# Patient Record
Sex: Female | Born: 1945 | Race: White | Hispanic: No | Marital: Married | Smoking: Never smoker
Health system: Southern US, Community
[De-identification: ages and names within clinical notes are randomized; demographics above are authoritative.]

## PROBLEM LIST (undated history)

## (undated) DIAGNOSIS — T8859XA Other complications of anesthesia, initial encounter: Secondary | ICD-10-CM

## (undated) DIAGNOSIS — T4145XA Adverse effect of unspecified anesthetic, initial encounter: Secondary | ICD-10-CM

## (undated) DIAGNOSIS — D649 Anemia, unspecified: Secondary | ICD-10-CM

## (undated) DIAGNOSIS — Z8489 Family history of other specified conditions: Secondary | ICD-10-CM

## (undated) DIAGNOSIS — Z889 Allergy status to unspecified drugs, medicaments and biological substances status: Secondary | ICD-10-CM

## (undated) DIAGNOSIS — R011 Cardiac murmur, unspecified: Secondary | ICD-10-CM

## (undated) HISTORY — PX: LUMBAR DISC SURGERY: SHX700

## (undated) HISTORY — PX: ABDOMINAL HYSTERECTOMY: SHX81

## (undated) HISTORY — DX: Allergy status to unspecified drugs, medicaments and biological substances: Z88.9

## (undated) HISTORY — PX: SPINE SURGERY: SHX786

## (undated) HISTORY — DX: Cardiac murmur, unspecified: R01.1

---

## 2000-11-11 ENCOUNTER — Other Ambulatory Visit: Admission: RE | Admit: 2000-11-11 | Discharge: 2000-11-11 | Payer: Self-pay | Admitting: Obstetrics and Gynecology

## 2001-12-28 ENCOUNTER — Encounter: Payer: Self-pay | Admitting: Obstetrics and Gynecology

## 2001-12-28 ENCOUNTER — Encounter: Admission: RE | Admit: 2001-12-28 | Discharge: 2001-12-28 | Payer: Self-pay | Admitting: Obstetrics and Gynecology

## 2004-12-20 ENCOUNTER — Ambulatory Visit (HOSPITAL_COMMUNITY): Admission: RE | Admit: 2004-12-20 | Discharge: 2004-12-21 | Payer: Self-pay | Admitting: Neurosurgery

## 2011-07-18 DIAGNOSIS — I10 Essential (primary) hypertension: Secondary | ICD-10-CM | POA: Diagnosis not present

## 2011-07-18 DIAGNOSIS — E7800 Pure hypercholesterolemia, unspecified: Secondary | ICD-10-CM | POA: Diagnosis not present

## 2011-07-18 DIAGNOSIS — M949 Disorder of cartilage, unspecified: Secondary | ICD-10-CM | POA: Diagnosis not present

## 2011-07-18 DIAGNOSIS — M899 Disorder of bone, unspecified: Secondary | ICD-10-CM | POA: Diagnosis not present

## 2011-07-23 DIAGNOSIS — I10 Essential (primary) hypertension: Secondary | ICD-10-CM | POA: Diagnosis not present

## 2011-07-23 DIAGNOSIS — Z23 Encounter for immunization: Secondary | ICD-10-CM | POA: Diagnosis not present

## 2011-07-23 DIAGNOSIS — Z0000 Encounter for general adult medical examination without abnormal findings: Secondary | ICD-10-CM | POA: Diagnosis not present

## 2011-07-23 DIAGNOSIS — G43109 Migraine with aura, not intractable, without status migrainosus: Secondary | ICD-10-CM | POA: Diagnosis not present

## 2011-08-02 DIAGNOSIS — Z124 Encounter for screening for malignant neoplasm of cervix: Secondary | ICD-10-CM | POA: Diagnosis not present

## 2011-08-02 DIAGNOSIS — Z01419 Encounter for gynecological examination (general) (routine) without abnormal findings: Secondary | ICD-10-CM | POA: Diagnosis not present

## 2011-08-02 DIAGNOSIS — N952 Postmenopausal atrophic vaginitis: Secondary | ICD-10-CM | POA: Diagnosis not present

## 2011-12-16 DIAGNOSIS — Z23 Encounter for immunization: Secondary | ICD-10-CM | POA: Diagnosis not present

## 2012-02-05 DIAGNOSIS — H1044 Vernal conjunctivitis: Secondary | ICD-10-CM | POA: Diagnosis not present

## 2012-02-18 DIAGNOSIS — Z1231 Encounter for screening mammogram for malignant neoplasm of breast: Secondary | ICD-10-CM | POA: Diagnosis not present

## 2012-06-29 DIAGNOSIS — J069 Acute upper respiratory infection, unspecified: Secondary | ICD-10-CM | POA: Diagnosis not present

## 2012-06-29 DIAGNOSIS — J209 Acute bronchitis, unspecified: Secondary | ICD-10-CM | POA: Diagnosis not present

## 2012-07-31 DIAGNOSIS — I10 Essential (primary) hypertension: Secondary | ICD-10-CM | POA: Diagnosis not present

## 2012-07-31 DIAGNOSIS — Z0000 Encounter for general adult medical examination without abnormal findings: Secondary | ICD-10-CM | POA: Diagnosis not present

## 2012-07-31 DIAGNOSIS — E7800 Pure hypercholesterolemia, unspecified: Secondary | ICD-10-CM | POA: Diagnosis not present

## 2012-07-31 DIAGNOSIS — G43109 Migraine with aura, not intractable, without status migrainosus: Secondary | ICD-10-CM | POA: Diagnosis not present

## 2012-09-09 DIAGNOSIS — L0231 Cutaneous abscess of buttock: Secondary | ICD-10-CM | POA: Diagnosis not present

## 2012-10-11 ENCOUNTER — Ambulatory Visit (INDEPENDENT_AMBULATORY_CARE_PROVIDER_SITE_OTHER): Payer: Medicare Other | Admitting: Family Medicine

## 2012-10-11 ENCOUNTER — Ambulatory Visit: Payer: Medicare Other

## 2012-10-11 VITALS — BP 132/70 | HR 100 | Temp 99.4°F | Resp 16 | Ht 61.5 in | Wt 118.6 lb

## 2012-10-11 DIAGNOSIS — R509 Fever, unspecified: Secondary | ICD-10-CM

## 2012-10-11 DIAGNOSIS — R059 Cough, unspecified: Secondary | ICD-10-CM | POA: Diagnosis not present

## 2012-10-11 DIAGNOSIS — R05 Cough: Secondary | ICD-10-CM | POA: Diagnosis not present

## 2012-10-11 LAB — POCT CBC
Granulocyte percent: 73.5 %G (ref 37–80)
HCT, POC: 40 % (ref 37.7–47.9)
Hemoglobin: 12.5 g/dL (ref 12.2–16.2)
Lymph, poc: 1.6 (ref 0.6–3.4)
MCH, POC: 29.7 pg (ref 27–31.2)
MCHC: 31.3 g/dL — AB (ref 31.8–35.4)
MCV: 95 fL (ref 80–97)
MID (cbc): 0.4 (ref 0–0.9)
MPV: 7.8 fL (ref 0–99.8)
POC Granulocyte: 5.7 (ref 2–6.9)
POC LYMPH PERCENT: 20.9 %L (ref 10–50)
POC MID %: 5.6 %M (ref 0–12)
Platelet Count, POC: 264 10*3/uL (ref 142–424)
RBC: 4.21 M/uL (ref 4.04–5.48)
RDW, POC: 13.8 %
WBC: 7.8 10*3/uL (ref 4.6–10.2)

## 2012-10-11 MED ORDER — LEVOFLOXACIN 500 MG PO TABS: 500.0000 mg | ORAL_TABLET | Freq: Every day | ORAL | Status: DC

## 2012-10-11 NOTE — Progress Notes (Signed)
67 yo woman retired [employer] and Record married to Smitty Cords who had bronchitis earlier this year.  Over the last week or so she has developed a dry cough and evening fevers to over 101 degrees F.   Also has night sweats and decreased appetite and loss of energy.  No chest pain or shortness of breath or swollen ankles  Objective:  NAD HEENT:  Unremarkable Neck:  Supple, no adenopathy Chest: rales left base Heart:  Reg, no murmur Skin: clear Ext: no edema  [facility] reading (PRIMARY) by  Dr. Milus Glazier:  Hazy LLL, post op scoliosis surgery  Results for orders placed in visit on 10/11/12  POCT CBC      Result Value Range   WBC 7.8  4.6 - 10.2 K/uL   Lymph, poc 1.6  0.6 - 3.4   POC LYMPH PERCENT 20.9  10 - 50 %L   MID (cbc) 0.4  0 - 0.9   POC MID % 5.6  0 - 12 %M   POC Granulocyte 5.7  2 - 6.9   Granulocyte percent 73.5  37 - 80 %G   RBC 4.21  4.04 - 5.48 M/uL   Hemoglobin 12.5  12.2 - 16.2 g/dL   HCT, POC 19.1  47.8 - 47.9 %   MCV 95.0  80 - 97 fL   MCH, POC 29.7  27 - 31.2 pg   MCHC 31.3 (*) 31.8 - 35.4 g/dL   RDW, POC 29.5     Platelet Count, POC 264  142 - 424 K/uL   MPV 7.8  0 - 99.8 fL   . Assessment: Clinically patient has left lower lobe pneumonia. Surprisingly, the white count is normal. In the context of nighttime fevers, him and have the patient at 10 days of antibiotics in hopes of resolving the infection in the lungs.

## 2012-10-14 DIAGNOSIS — Z124 Encounter for screening for malignant neoplasm of cervix: Secondary | ICD-10-CM | POA: Diagnosis not present

## 2012-10-14 DIAGNOSIS — Z01419 Encounter for gynecological examination (general) (routine) without abnormal findings: Secondary | ICD-10-CM | POA: Diagnosis not present

## 2012-10-19 DIAGNOSIS — M899 Disorder of bone, unspecified: Secondary | ICD-10-CM | POA: Diagnosis not present

## 2012-10-19 DIAGNOSIS — M949 Disorder of cartilage, unspecified: Secondary | ICD-10-CM | POA: Diagnosis not present

## 2012-10-29 ENCOUNTER — Ambulatory Visit (INDEPENDENT_AMBULATORY_CARE_PROVIDER_SITE_OTHER): Payer: Medicare Other | Admitting: Emergency Medicine

## 2012-10-29 VITALS — BP 134/62 | HR 96 | Temp 98.1°F | Resp 18 | Ht 61.0 in | Wt 117.0 lb

## 2012-10-29 DIAGNOSIS — R509 Fever, unspecified: Secondary | ICD-10-CM | POA: Diagnosis not present

## 2012-10-29 LAB — POCT CBC
Granulocyte percent: 77.6 %G (ref 37–80)
HCT, POC: 41 % (ref 37.7–47.9)
Hemoglobin: 13.1 g/dL (ref 12.2–16.2)
MCV: 94.5 fL (ref 80–97)
MID (cbc): 0.4 (ref 0–0.9)
Platelet Count, POC: 240 10*3/uL (ref 142–424)
RBC: 4.34 M/uL (ref 4.04–5.48)

## 2012-10-29 NOTE — Patient Instructions (Addendum)

## 2012-10-29 NOTE — Progress Notes (Signed)
[facility] [address], [place] [address] [phone]  Date:  10/29/2012   Name:  Lisa Cabrera   DOB:  [birth year]   MRN:  [id number]  PCP:  Miguel Aschoff, MD    Chief Complaint: cough, night time fevers and night sweats   History of Present Illness:  Lisa Cabrera is a 67 y.o. very pleasant female patient who presents with the following:  Ill with fever, night sweats and cough.  Says cough is not productive, no wheezing or shortness of breath.  No nasal congestion or drainage.  No sore throat.  Poor appetite. No nausea or vomiting. No stool change.  No hemoptysis or exposure to TB.  Similar symptoms in March and earlier this month both times treated with antibiotics.  Had resolution and now has recurred past few days.  No improvement with over the counter medications or other home remedies. Denies other complaint or health concern today.   There are no active problems to display for this patient.   Past Medical History  Diagnosis Date  . H/O seasonal allergies   . Heart murmur     Past Surgical History  Procedure Laterality Date  . Abdominal hysterectomy      1997  . Spine surgery      History  Substance Use Topics  . Smoking status: Never Smoker   . Smokeless tobacco: Not on file  . Alcohol Use: Yes     Comment: occasionally    Family History  Problem Relation Age of Onset  . Dementia Mother   . Stroke Father   . Kidney disease Father   . Cancer Sister     ovarian    No Known Allergies  Medication list has been reviewed and updated.  Current Outpatient Prescriptions on File Prior to Visit  Medication Sig Dispense Refill  . aspirin 81 MG tablet Take 81 mg by mouth daily.      . Cholecalciferol (VITAMIN D) 2000 UNITS CAPS Take by mouth daily.      Marland Kitchen doxepin (SINEQUAN) 50 MG capsule Take 50 mg by mouth at bedtime.      Marland Kitchen levofloxacin (LEVAQUIN) 500 MG tablet Take 1 tablet (500 mg total) by mouth daily.  10 tablet  0  .  Multiple Vitamins-Minerals (MULTIVITAMIN PO) Take by mouth daily.      . Omega-3 Fatty Acids (FISH OIL PO) Take by mouth daily.      . verapamil (VERELAN PM) 240 MG 24 hr capsule Take 240 mg by mouth daily.       No current facility-administered medications on file prior to visit.    Review of Systems:  As per HPI, otherwise negative.    Physical Examination: Filed Vitals:   10/29/12 1714  BP: 134/62  Pulse: 96  Temp: 98.1 F (36.7 C)  Resp: 18   Filed Vitals:   10/29/12 1714  Height: 5\' 1"  (1.549 m)  Weight: 117 lb (53.071 kg)   Body mass index is 22.12 kg/(m^2). Ideal Body Weight: Weight in (lb) to have BMI = 25: 132  GEN: WDWN, NAD, Non-toxic, A & O x 3 HEENT: Atraumatic, Normocephalic. Neck supple. No masses, No LAD. Ears and Nose: No external deformity. CV: RRR, No M/G/R. No JVD. No thrill. No extra heart sounds. PULM: CTA B, no wheezes, crackles, rhonchi. No retractions. No resp. distress. No accessory muscle use. ABD: S, NT, ND, +BS. No rebound. No HSM. EXTR: No c/c/e NEURO Normal gait.  PSYCH:  Normally interactive. Conversant. Not depressed or anxious appearing.  Calm demeanor.    Assessment and Plan: Bronchitis Follow up as needed   Signed,  Phillips Odor, MD   Results for orders placed in visit on 10/29/12  POCT CBC      Result Value Range   WBC 9.5  4.6 - 10.2 K/uL   Lymph, poc 1.7  0.6 - 3.4   POC LYMPH PERCENT 18.2  10 - 50 %L   MID (cbc) 0.4  0 - 0.9   POC MID % 4.2  0 - 12 %M   POC Granulocyte 7.4 (*) 2 - 6.9   Granulocyte percent 77.6  37 - 80 %G   RBC 4.34  4.04 - 5.48 M/uL   Hemoglobin 13.1  12.2 - 16.2 g/dL   HCT, POC 78.2  95.6 - 47.9 %   MCV 94.5  80 - 97 fL   MCH, POC 30.2  27 - 31.2 pg   MCHC 32.0  31.8 - 35.4 g/dL   RDW, POC 21.3     Platelet Count, POC 240  142 - 424 K/uL   MPV 7.6  0 - 99.8 fL

## 2012-11-04 ENCOUNTER — Other Ambulatory Visit: Payer: Self-pay

## 2012-11-04 ENCOUNTER — Ambulatory Visit (INDEPENDENT_AMBULATORY_CARE_PROVIDER_SITE_OTHER): Payer: Medicare Other | Admitting: Family Medicine

## 2012-11-04 VITALS — BP 118/72 | HR 99 | Temp 99.0°F | Resp 17 | Ht 61.0 in | Wt 117.0 lb

## 2012-11-04 DIAGNOSIS — R509 Fever, unspecified: Secondary | ICD-10-CM | POA: Diagnosis not present

## 2012-11-04 DIAGNOSIS — R059 Cough, unspecified: Secondary | ICD-10-CM

## 2012-11-04 DIAGNOSIS — R05 Cough: Secondary | ICD-10-CM

## 2012-11-04 MED ORDER — LEVOFLOXACIN 500 MG PO TABS: 500.0000 mg | ORAL_TABLET | Freq: Every day | ORAL | Status: DC

## 2012-11-04 NOTE — Progress Notes (Signed)
67 yo woman with 1 month of dry cough which  Originally seemed to respond to Levaquin but recurred after 10 days off the medicine.  Some loss of appetite.  Objective:  NAD Oroph:  Clear Chest: rales left base Cor:  Reg, no  Murmur Skin: clear Ext:  No edema.  Assessment: unusual  Syndrome of cough and evening fever with night  Sweats.  Plan:   Cough - Plan: levofloxacin (LEVAQUIN) 500 MG tablet  Fever, unspecified - Plan: levofloxacin (LEVAQUIN) 500 MG tablet If not improving, or if sx recur, chest CT/consult/TB test.  Signed, Elvina Sidle, MD

## 2012-11-19 ENCOUNTER — Ambulatory Visit: Payer: Medicare Other

## 2012-11-19 ENCOUNTER — Ambulatory Visit (INDEPENDENT_AMBULATORY_CARE_PROVIDER_SITE_OTHER): Payer: Medicare Other | Admitting: Family Medicine

## 2012-11-19 VITALS — BP 120/64 | HR 107 | Temp 98.7°F | Resp 18 | Ht 61.5 in | Wt 115.0 lb

## 2012-11-19 DIAGNOSIS — R05 Cough: Secondary | ICD-10-CM | POA: Diagnosis not present

## 2012-11-19 DIAGNOSIS — Z111 Encounter for screening for respiratory tuberculosis: Secondary | ICD-10-CM | POA: Diagnosis not present

## 2012-11-19 DIAGNOSIS — R059 Cough, unspecified: Secondary | ICD-10-CM

## 2012-11-19 DIAGNOSIS — R509 Fever, unspecified: Secondary | ICD-10-CM | POA: Diagnosis not present

## 2012-11-19 LAB — POCT URINALYSIS DIPSTICK
Bilirubin, UA: NEGATIVE
Leukocytes, UA: NEGATIVE
Nitrite, UA: NEGATIVE
pH, UA: 6.5

## 2012-11-19 LAB — POCT CBC
HCT, POC: 44.3 % (ref 37.7–47.9)
Lymph, poc: 1.6 (ref 0.6–3.4)
MCHC: 31.8 g/dL (ref 31.8–35.4)
POC Granulocyte: 5.9 (ref 2–6.9)
POC LYMPH PERCENT: 20.8 %L (ref 10–50)
RDW, POC: 14.6 %

## 2012-11-19 LAB — POCT SEDIMENTATION RATE: POCT SED RATE: 64 mm/hr — AB (ref 0–22)

## 2012-11-19 MED ORDER — AZITHROMYCIN 250 MG PO TABS: ORAL_TABLET | ORAL | Status: DC

## 2012-11-19 NOTE — Progress Notes (Signed)
[address]   [place], [place]  [address]   [phone]  Subjective:    Patient ID: Lisa Cabrera, female    DOB: [birth year], 67 y.o.   MRN: [id number]  HPI This 67 y.o. female presents for evaluation of persistent cough.  Onset of symptoms in 05/2012.  Had fever, night sweats, cough; treated with Zpack and symptoms resolved.  After a trip to [place] in 09/2012, developed the same thing with cough, fever, night sweats.  10/11/12-- Prescribed Levaquin by [facility]; improved for one week and then symptoms recurred (fever, night sweats, cough).  Returned for reevaluation by Dareen Piano on 7/31 due to recurrent symptoms; no treatment prescribed;  symptoms did not resolve.  Reevaluated by [facility] on 11/04/12; rx for Levaquin ten days with resolution of symptoms temporarily.   Last night on 11/18/12, night sweats, fever Tmax 101.3.  No sputum.  No cough now.  No SOB.  Decreased appetite.  No rhinorrhea, nasal congestion, sore throat, ear pain.  No n/v/d/c.  No abdominal pain.  No urinary symptoms; no dysuria, urgency, hematuria.  No joint pains, swelling, myalgias.  No tick bites this summer.   Traveled to [facility] July 4-9; flew.  No foreign travel.  Some weight loss.  Weight down 3 pounds.  No smoking hx.  No other family members sick.  Very worried about recurrent symptoms.  PCP: Tenny Craw at [place]  Review of Systems  Constitutional: Positive for fever, diaphoresis, appetite change and unexpected weight change. Negative for activity change.  HENT: Negative for ear pain, congestion, sore throat, rhinorrhea, sneezing, trouble swallowing, voice change and postnasal drip.   Respiratory: Negative for cough, shortness of breath, wheezing and stridor.   Cardiovascular: Negative for chest pain, palpitations and leg swelling.  Gastrointestinal: Negative for nausea, vomiting, abdominal pain, diarrhea, constipation, blood in stool, anal bleeding and rectal pain.  Genitourinary: Negative for dysuria, urgency,  frequency, hematuria and flank pain.  Musculoskeletal: Negative for myalgias, back pain and arthralgias.  Skin: Negative for rash.  Neurological: Negative for headaches.   Past Medical History  Diagnosis Date  . H/O seasonal allergies   . Heart murmur    Past Surgical History  Procedure Laterality Date  . Abdominal hysterectomy      1997  . Spine surgery     No Known Allergies Current Outpatient Prescriptions on File Prior to Visit  Medication Sig Dispense Refill  . aspirin 81 MG tablet Take 81 mg by mouth daily.      . Cholecalciferol (VITAMIN D) 2000 UNITS CAPS Take by mouth daily.      Marland Kitchen doxepin (SINEQUAN) 50 MG capsule Take 50 mg by mouth at bedtime.      . Multiple Vitamins-Minerals (MULTIVITAMIN PO) Take by mouth daily.      . Omega-3 Fatty Acids (FISH OIL PO) Take by mouth daily.      . verapamil (VERELAN PM) 240 MG 24 hr capsule Take 240 mg by mouth daily.      Marland Kitchen levofloxacin (LEVAQUIN) 500 MG tablet Take 1 tablet (500 mg total) by mouth daily.  10 tablet  0   No current facility-administered medications on file prior to visit.       Objective:   Physical Exam  Nursing note and vitals reviewed. Constitutional: She is oriented to person, place, and time. She appears well-developed and well-nourished. No distress.  HENT:  Head: Normocephalic and atraumatic.  Right Ear: External ear normal.  Left Ear: External ear normal.  Nose: Nose normal.  Mouth/Throat: Oropharynx is clear and moist.  Eyes: Conjunctivae and EOM are normal. Pupils are equal, round, and reactive to light.  Neck: Normal range of motion. Neck supple. No thyromegaly present.  Cardiovascular: Normal rate, regular rhythm and intact distal pulses.   Murmur heard. Pulmonary/Chest: Effort normal and breath sounds normal. No respiratory distress. She has no wheezes. She has no rales.  Abdominal: Soft. Bowel sounds are normal. She exhibits no distension and no mass. There is no tenderness. There is no  rebound and no guarding.  Musculoskeletal: Normal range of motion.  Lymphadenopathy:    She has no cervical adenopathy.  Neurological: She is alert and oriented to person, place, and time.  Skin: Skin is warm and dry. No rash noted. She is not diaphoretic.  Psychiatric: She has a normal mood and affect. Her behavior is normal.   RECORDS REVIEWED FROM THREE PREVIOUS VISITS.     Results for orders placed in visit on 11/19/12  POCT CBC      Result Value Range   WBC 7.9  4.6 - 10.2 K/uL   Lymph, poc 1.6  0.6 - 3.4   POC LYMPH PERCENT 20.8  10 - 50 %L   MID (cbc) 0.4  0 - 0.9   POC MID % 4.6  0 - 12 %M   POC Granulocyte 5.9  2 - 6.9   Granulocyte percent 74.6  37 - 80 %G   RBC 4.70  4.04 - 5.48 M/uL   Hemoglobin 14.1  12.2 - 16.2 g/dL   HCT, POC 14.7  82.9 - 47.9 %   MCV 94.3  80 - 97 fL   MCH, POC 30.0  27 - 31.2 pg   MCHC 31.8  31.8 - 35.4 g/dL   RDW, POC 56.2     Platelet Count, POC 256  142 - 424 K/uL   MPV 7.6  0 - 99.8 fL  POCT URINALYSIS DIPSTICK      Result Value Range   Color, UA yellow     Clarity, UA clear     Glucose, UA neg     Bilirubin, UA neg     Ketones, UA neg     Spec Grav, UA <=1.005     Blood, UA trace-intact     pH, UA 6.5     Protein, UA neg     Urobilinogen, UA 0.2     Nitrite, UA neg     Leukocytes, UA Negative     [facility] reading (PRIMARY) by  Dr. Katrinka Blazing. CXR: NAD.  PPD placed by Georg Ruddle.   Assessment & Plan:  Cough - Plan: POCT SEDIMENTATION RATE, TB Skin Test, DG Chest 2 View  Fever, unspecified - Plan: POCT CBC, POCT urinalysis dipstick, CANCELED: POCT UA - Microscopic Only  1.  Recurrent fever:  Persistent/recurrent issue for the past 4-6 weeks.  No associated symptoms with acute fever yet recurrent cough in past six weeks.  Benign exam. CXR negative.  PPD placed; to start Zithromax if cough develops. Refer to ID for further evaluation.  If develops cough, consider CT chest to rule out contributing etiology to fever and cough.   Non-toxic.  Close follow-up. 2.  Recurrent cough: Improved at this time but associated with fever, night sweats.  CXR negative; PPD placed.  Ddx includes infectious process, rheumatological process.

## 2012-11-19 NOTE — Progress Notes (Signed)
  Tuberculosis Risk Questionnaire  1. No Were you born outside the [place] in one of the following parts of the world: [place], [place], [place], [place] or [place]?     2. No Have you traveled outside the [place] and lived for more than one month in one of the following parts of the world: [place], [place], [place], [place] or [place]?    3. No Do you have a compromised immune system such as from any of the following conditions:HIV/AIDS, organ or bone marrow transplantation, diabetes, immunosuppressive medicines (e.g. Prednisone, Remicaide), leukemia, lymphoma, cancer of the head or neck, gastrectomy or jejunal bypass, end-stage renal disease (on dialysis), or silicosis?     4. No Have you ever or do you plan on working in: a residential care center, a health care facility, a jail or prison or homeless shelter?    5. No Have you ever: injected illegal drugs, used crack cocaine, lived in a homeless shelter  or been in jail or prison?     6. No Have you ever been exposed to anyone with infectious tuberculosis?    Tuberculosis Symptom Questionnaire  Do you currently have any of the following symptoms?  1. Yes  Unexplained cough lasting more than 3 weeks?   2. Yes  Unexplained fever lasting more than 3 weeks.   3. Yes  Night Sweats (sweating that leaves the bedclothes and sheets wet)     4. No Shortness of Breath   5. No Chest Pain   6. Yes  Unintentional weight loss    7. No Unexplained fatigue (very tired for no reason)

## 2012-11-19 NOTE — Patient Instructions (Addendum)
1. RETURN IN 48-72 HOURS FOR TB SKIN TEST READ. 2.  START ZITHROMAX IF YOU DEVELOP A COUGH. 3.  YOU WILL BE CONTACTED IN THE UPCOMING WEEK WITH AN APPOINTMENT WITH INFECTIOUS DISEASE SPECIALIST.

## 2012-11-21 ENCOUNTER — Ambulatory Visit (INDEPENDENT_AMBULATORY_CARE_PROVIDER_SITE_OTHER): Payer: Medicare Other | Admitting: *Deleted

## 2012-11-21 DIAGNOSIS — Z111 Encounter for screening for respiratory tuberculosis: Secondary | ICD-10-CM

## 2012-11-21 DIAGNOSIS — Z09 Encounter for follow-up examination after completed treatment for conditions other than malignant neoplasm: Secondary | ICD-10-CM

## 2012-11-21 LAB — TB SKIN TEST: TB Skin Test: NEGATIVE

## 2012-11-22 ENCOUNTER — Ambulatory Visit (INDEPENDENT_AMBULATORY_CARE_PROVIDER_SITE_OTHER): Payer: Medicare Other | Admitting: Family Medicine

## 2012-11-22 VITALS — BP 123/69 | HR 94 | Temp 97.8°F | Resp 16 | Ht 61.5 in | Wt 115.0 lb

## 2012-11-22 DIAGNOSIS — R05 Cough: Secondary | ICD-10-CM

## 2012-11-22 DIAGNOSIS — R509 Fever, unspecified: Secondary | ICD-10-CM

## 2012-11-22 DIAGNOSIS — R059 Cough, unspecified: Secondary | ICD-10-CM

## 2012-11-22 LAB — POCT CBC
Hemoglobin: 12.6 g/dL (ref 12.2–16.2)
MCH, POC: 29.4 pg (ref 27–31.2)
MPV: 7.3 fL (ref 0–99.8)
POC Granulocyte: 5.4 (ref 2–6.9)
POC MID %: 5.3 %M (ref 0–12)
RBC: 4.29 M/uL (ref 4.04–5.48)
WBC: 7.4 10*3/uL (ref 4.6–10.2)

## 2012-11-22 LAB — POCT SEDIMENTATION RATE: POCT SED RATE: 60 mm/hr — AB (ref 0–22)

## 2012-11-22 NOTE — Progress Notes (Signed)
[address]   [place], [place]  [address]   [phone]  Subjective:    Patient ID: Lisa Cabrera, female    DOB: 1946-01-05, 67 y.o.   MRN: [id number]  HPI This 67 y.o. female presents for 72 hour follow-up/evaluation of fever. Onset of fever 11/17/12.  Fever trending down; TMax 100.1.  +chills but less severe.  Starts mid to late afternoon; night sweats are no better.  Appetite still down.  Never started coughing so never started Zithromax. S/p PPD negative; returned yesterday for read.  Does not feel that badly; no headache; no ST, ear pain, rhinorrhea, nasal congestion.  No cough other than small cough. No SOB.  No n/v/d.  No dysuria, frequency. No history of recurrent UTIs.  No rash.  No muscle aches; no chest pain, palpitations.  No joint pains or aches. No abdominal pain  Appetite is off; weight down. Jerolyn Center, MD of ID appointment on September 3.  Keeping a fever log; record it once per day and anytime pt feels hot. No family history of autoimmune diseases. Mammogram last fall 21308.  Pap smear UTD.  Colonoscopy UTD 2009.     Review of Systems  Constitutional: Positive for fever, chills, diaphoresis, appetite change, fatigue and unexpected weight change. Negative for activity change.  HENT: Negative for ear pain, congestion, sore throat, rhinorrhea, sneezing and postnasal drip.   Gastrointestinal: Negative for nausea, vomiting, abdominal pain, diarrhea, constipation and blood in stool.  Genitourinary: Negative for dysuria, urgency, frequency and hematuria.  Musculoskeletal: Negative for myalgias, back pain, joint swelling, arthralgias and gait problem.  Skin: Negative for rash.  Neurological: Negative for headaches.    Past Medical History  Diagnosis Date  . H/O seasonal allergies   . Heart murmur     Past Surgical History  Procedure Laterality Date  . Abdominal hysterectomy      1997  . Spine surgery      Prior to Admission medications   Medication Sig  Start Date End Date Taking? Authorizing Provider  aspirin 81 MG tablet Take 81 mg by mouth daily.   Yes Historical Provider, MD  azithromycin (ZITHROMAX) 250 MG tablet 2 tablets daily x 5 days 11/19/12  Yes Ethelda Chick, MD  Cholecalciferol (VITAMIN D) 2000 UNITS CAPS Take by mouth daily.   Yes Historical Provider, MD  doxepin (SINEQUAN) 50 MG capsule Take 50 mg by mouth at bedtime.   Yes Historical Provider, MD  levofloxacin (LEVAQUIN) 500 MG tablet Take 1 tablet (500 mg total) by mouth daily. 11/04/12  Yes Elvina Sidle, MD  Multiple Vitamins-Minerals (MULTIVITAMIN PO) Take by mouth daily.   Yes Historical Provider, MD  Omega-3 Fatty Acids (FISH OIL PO) Take by mouth daily.   Yes Historical Provider, MD  verapamil (VERELAN PM) 240 MG 24 hr capsule Take 240 mg by mouth daily.   Yes Historical Provider, MD    No Known Allergies  History   Social History  . Marital Status: Married    Spouse Name: N/A    Number of Children: 1  . Years of Education: college   Occupational History  . retired    Social History Main Topics  . Smoking status: Never Smoker   . Smokeless tobacco: Not on file  . Alcohol Use: Yes     Comment: occasionally  . Drug Use: No  . Sexual Activity: Yes   Other Topics Concern  . Not on file   Social History Narrative   Exercise: walk  Family History  Problem Relation Age of Onset  . Dementia Mother   . Stroke Father   . Kidney disease Father   . Cancer Sister     ovarian       Objective:   Physical Exam  Nursing note and vitals reviewed. Constitutional: She is oriented to person, place, and time. She appears well-developed and well-nourished. No distress.  HENT:  Head: Normocephalic and atraumatic.  Right Ear: External ear normal.  Left Ear: External ear normal.  Nose: Nose normal.  Mouth/Throat: Oropharynx is clear and moist.  Eyes: Conjunctivae and EOM are normal. Pupils are equal, round, and reactive to light.  Neck: Normal range of  motion. Neck supple. No thyromegaly present.  Cardiovascular: Normal rate and regular rhythm.  Exam reveals no gallop and no friction rub.   Murmur heard. Pulmonary/Chest: Effort normal and breath sounds normal. She has no wheezes. She has no rales.  Abdominal: Soft. Bowel sounds are normal. She exhibits no distension and no mass. There is no tenderness. There is no rebound and no guarding.  Distention lower abdomen; non-tender.  Lymphadenopathy:    She has no cervical adenopathy.  Neurological: She is alert and oriented to person, place, and time. No cranial nerve deficit. She exhibits normal muscle tone. Coordination normal.  Skin: Skin is warm and dry. No rash noted. She is not diaphoretic.  Psychiatric: She has a normal mood and affect. Her behavior is normal.   Results for orders placed in visit on 11/22/12  POCT CBC      Result Value Range   WBC 7.4  4.6 - 10.2 K/uL   Lymph, poc 1.6  0.6 - 3.4   POC LYMPH PERCENT 21.8  10 - 50 %L   MID (cbc) 0.4  0 - 0.9   POC MID % 5.3  0 - 12 %M   POC Granulocyte 5.4  2 - 6.9   Granulocyte percent 72.9  37 - 80 %G   RBC 4.29  4.04 - 5.48 M/uL   Hemoglobin 12.6  12.2 - 16.2 g/dL   HCT, POC 40.9  81.1 - 47.9 %   MCV 93.3  80 - 97 fL   MCH, POC 29.4  27 - 31.2 pg   MCHC 31.5 (*) 31.8 - 35.4 g/dL   RDW, POC 91.4     Platelet Count, POC 219  142 - 424 K/uL   MPV 7.3  0 - 99.8 fL       Assessment & Plan:  Fever, unspecified - Plan: POCT CBC, POCT SEDIMENTATION RATE, CK, ANA, TSH  1.  Fever:  Onset five days ago but recurrent episodes of fever with cough for the past three months.  S/p CXR negative.  S/p PPD negative.  Has not developed cough with acute fever; non-focal exam. Normal CBC with each episode of acute illness for past three months.  Appointment on 12/01/12 with infectious disease.  Obtain ESR, CK, ANA,TSH.  Urine negative at last visit.  Chronic history of heart murmur.

## 2012-11-23 LAB — ANA: Anti Nuclear Antibody(ANA): NEGATIVE

## 2012-11-26 ENCOUNTER — Encounter: Payer: Self-pay | Admitting: Family Medicine

## 2012-12-02 ENCOUNTER — Encounter: Payer: Self-pay | Admitting: Internal Medicine

## 2012-12-02 ENCOUNTER — Ambulatory Visit (INDEPENDENT_AMBULATORY_CARE_PROVIDER_SITE_OTHER): Payer: Medicare Other | Admitting: Internal Medicine

## 2012-12-02 VITALS — BP 127/46 | HR 103 | Temp 98.3°F | Wt 114.0 lb

## 2012-12-02 DIAGNOSIS — D649 Anemia, unspecified: Secondary | ICD-10-CM

## 2012-12-02 DIAGNOSIS — R7881 Bacteremia: Secondary | ICD-10-CM

## 2012-12-02 DIAGNOSIS — R509 Fever, unspecified: Secondary | ICD-10-CM

## 2012-12-02 LAB — CBC WITH DIFFERENTIAL/PLATELET
Basophils Relative: 0 % (ref 0–1)
Eosinophils Absolute: 0.1 10*3/uL (ref 0.0–0.7)
MCH: 29 pg (ref 26.0–34.0)
MCHC: 33.8 g/dL (ref 30.0–36.0)
Monocytes Relative: 4 % (ref 3–12)
Neutrophils Relative %: 78 % — ABNORMAL HIGH (ref 43–77)
Platelets: 246 10*3/uL (ref 150–400)
RDW: 14.1 % (ref 11.5–15.5)

## 2012-12-02 LAB — COMPLETE METABOLIC PANEL WITHOUT GFR
ALT: 19 U/L (ref 0–35)
AST: 25 U/L (ref 0–37)
Albumin: 3.5 g/dL (ref 3.5–5.2)
Alkaline Phosphatase: 69 U/L (ref 39–117)
BUN: 13 mg/dL (ref 6–23)
CO2: 25 meq/L (ref 19–32)
Calcium: 9 mg/dL (ref 8.4–10.5)
Chloride: 100 meq/L (ref 96–112)
Creat: 0.84 mg/dL (ref 0.50–1.10)
GFR, Est African American: 84 mL/min
GFR, Est Non African American: 73 mL/min
Glucose, Bld: 100 mg/dL — ABNORMAL HIGH (ref 70–99)
Potassium: 3.9 meq/L (ref 3.5–5.3)
Sodium: 135 meq/L (ref 135–145)
Total Bilirubin: 0.6 mg/dL (ref 0.3–1.2)
Total Protein: 7.1 g/dL (ref 6.0–8.3)

## 2012-12-02 LAB — C-REACTIVE PROTEIN: CRP: 7.8 mg/dL — ABNORMAL HIGH (ref <0.60)

## 2012-12-02 NOTE — Progress Notes (Signed)
[facility]IN[facility] NOTE  RFV: fever of unknown origin Subjective:    Patient ID: Lisa Cabrera, female    DOB: 02/20/1946, 67 y.o.   MRN: [id number]  HPI Valor is a 67yo F with history of childhood murmur, spinal fusion for scoliosis who is otherwise in good state of health. She states that she has had 2 bouts of illness in the last 6 months. She reported having cold and cough and congestion in March, with fevers, treated with zpak which improved. She reported being back to her usual health up until July 4-9th when she went to [place] when she noticed having recurrent, cough, fevers, and nightsweat. Went to Urgent Care where she was prescribed levofloxacin x 10 days, cxr. PE showed "rales". Improved symptoms during the time she was taking antibiotics but then had recurrent symptoms after a 1 wk after being off of antibiotics. Then she was seen again at [facility] seen again by [place] and represcribed levofloxacin which she finished mid aug. Since then she has noted to have fevers up to 101.5 on a daily basis for the last 10 days usually having fevers in the mid day and then also in the evening, as noted in her fever diary from late august and sep 2nd.  She often wakes up drenched in sweat. Feels good in the am but has fevers in afternoon and evening. She is off of any antibiotics. Her pcp has already check PPD which was negative, recent cxr clear. ANA is negative.   pmhx:  heart murmur since childhood Spinal fusion at 67 yo. Secondary scoliosis Hysterectomy  Paroxysmal tachycardia post surgery Hernia disc repair  No Known Allergies  Current Outpatient Prescriptions on File Prior to Visit  Medication Sig Dispense Refill  . aspirin 81 MG tablet Take 81 mg by mouth daily.      . Cholecalciferol (VITAMIN D) 2000 UNITS CAPS Take by mouth daily.      Marland Kitchen doxepin (SINEQUAN) 50 MG capsule Take 50 mg by mouth at bedtime.      . Multiple Vitamins-Minerals (MULTIVITAMIN PO) Take by mouth daily.      . Omega-3  Fatty Acids (FISH OIL PO) Take by mouth daily.      . verapamil (VERELAN PM) 240 MG 24 hr capsule Take 240 mg by mouth daily.       No current facility-administered medications on file prior to visit.   History  Substance Use Topics  . Smoking status: Never Smoker   . Smokeless tobacco: Not on file  . 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 Married for 44yo. Children 1 daughter, 66 yo. Living. Walks.  family history includes Cancer in her sister; Dementia in her mother; Kidney disease in her father; Stroke in her father.  Review of Systems  Constitutional: fever off of antibiotics tmax 101.5 , chills, night sweats, decrease energy, decrease appetite, fatigue and unexpected weight change. Roughly 5 since mid July. HENT: Negative for congestion, sore throat, rhinorrhea, sneezing, trouble swallowing and sinus pressure.  Eyes: Negative for photophobia and visual disturbance.  Respiratory: Negative for cough, chest tightness, shortness of breath, wheezing and stridor.  Cardiovascular: Negative for chest pain, palpitations and leg swelling.  Gastrointestinal: Negative for nausea, vomiting, abdominal pain, diarrhea, constipation, blood in stool, abdominal distention and anal bleeding.  Genitourinary: Negative for dysuria, hematuria, flank pain and difficulty urinating.  Musculoskeletal: Negative for myalgias, back pain, joint swelling, arthralgias and gait  problem.  Skin: Negative for color change, pallor, rash and wound.  Neurological: Negative for dizziness, tremors, weakness and light-headedness.  Hematological: Negative for adenopathy. Does not bruise/bleed easily.  Psychiatric/Behavioral: Negative for behavioral problems, confusion, sleep disturbance, dysphoric mood, decreased concentration and agitation.       Objective:   Physical Exam  BP 127/46  Pulse 103  Temp(Src) 98.3 F (36.8 C) (Oral)  Wt 114 lb (51.71 kg)   BMI 21.19 kg/m2  Constitutional: oriented to person, place, and time. appears well-developed and well-nourished. No distress.  HENT: no thrush,  Mouth/Throat: Oropharynx is clear and moist. No oropharyngeal exudate.  Cardiovascular: Normal rate, regular rhythm and normal heart sounds. Exam reveals no gallop and no friction rub. No audible murmurs. No murmur heard.  Pulmonary/Chest: Effort normal and breath sounds normal. No respiratory distress. no wheezes.  Abdominal: Soft. Bowel sounds are normal.  exhibits no distension. There is no tenderness.  Lymphadenopathy:  no cervical adenopathy.  Axilla: +left axillary mass, acc. Breast tissue Neurological: alert and oriented to person, place, and time.  Skin: Skin is warm and dry. No rash noted. No erythema. No splinter hemorrhage Psychiatric:  a normal mood and affect.  behavior is normal.      Assessment & Plan:   Fever of unknown origin = we will do initial labs to determine if infectious, auto-immune, or possibly malingnancy related. Will check cbc, cmp, sed rate, crp, blood cultures, sPEP, uPEP, quantiferon and RF.   Addendum: as of evening of 9/4, on day 1 of blood culture, gram positive cocci are identified. Plan on having the patient admitted to the hospital in order to start IV antibiotics and do further work up for bacteremia.

## 2012-12-03 DIAGNOSIS — R509 Fever, unspecified: Secondary | ICD-10-CM | POA: Insufficient documentation

## 2012-12-04 ENCOUNTER — Other Ambulatory Visit: Payer: Medicare Other

## 2012-12-04 ENCOUNTER — Emergency Department (HOSPITAL_COMMUNITY): Payer: Medicare Other

## 2012-12-04 ENCOUNTER — Encounter (HOSPITAL_COMMUNITY): Payer: Self-pay | Admitting: Emergency Medicine

## 2012-12-04 ENCOUNTER — Inpatient Hospital Stay (HOSPITAL_COMMUNITY)
Admission: EM | Admit: 2012-12-04 | Discharge: 2012-12-09 | DRG: 289 | Disposition: A | Discharge status: Home Health | Payer: Medicare Other | Attending: Internal Medicine | Admitting: Internal Medicine

## 2012-12-04 DIAGNOSIS — I079 Rheumatic tricuspid valve disease, unspecified: Secondary | ICD-10-CM | POA: Diagnosis present

## 2012-12-04 DIAGNOSIS — D649 Anemia, unspecified: Secondary | ICD-10-CM | POA: Diagnosis not present

## 2012-12-04 DIAGNOSIS — R7611 Nonspecific reaction to tuberculin skin test without active tuberculosis: Secondary | ICD-10-CM | POA: Diagnosis present

## 2012-12-04 DIAGNOSIS — I330 Acute and subacute infective endocarditis: Principal | ICD-10-CM | POA: Diagnosis present

## 2012-12-04 DIAGNOSIS — Z7982 Long term (current) use of aspirin: Secondary | ICD-10-CM | POA: Diagnosis not present

## 2012-12-04 DIAGNOSIS — R739 Hyperglycemia, unspecified: Secondary | ICD-10-CM

## 2012-12-04 DIAGNOSIS — I059 Rheumatic mitral valve disease, unspecified: Secondary | ICD-10-CM | POA: Diagnosis not present

## 2012-12-04 DIAGNOSIS — R509 Fever, unspecified: Secondary | ICD-10-CM | POA: Diagnosis not present

## 2012-12-04 DIAGNOSIS — I341 Nonrheumatic mitral (valve) prolapse: Secondary | ICD-10-CM

## 2012-12-04 DIAGNOSIS — E871 Hypo-osmolality and hyponatremia: Secondary | ICD-10-CM | POA: Diagnosis present

## 2012-12-04 DIAGNOSIS — I669 Occlusion and stenosis of unspecified cerebral artery: Secondary | ICD-10-CM | POA: Diagnosis not present

## 2012-12-04 DIAGNOSIS — I4949 Other premature depolarization: Secondary | ICD-10-CM | POA: Diagnosis present

## 2012-12-04 DIAGNOSIS — Z79899 Other long term (current) drug therapy: Secondary | ICD-10-CM | POA: Diagnosis not present

## 2012-12-04 DIAGNOSIS — A498 Other bacterial infections of unspecified site: Secondary | ICD-10-CM | POA: Diagnosis not present

## 2012-12-04 DIAGNOSIS — R7309 Other abnormal glucose: Secondary | ICD-10-CM | POA: Diagnosis present

## 2012-12-04 DIAGNOSIS — R7881 Bacteremia: Secondary | ICD-10-CM

## 2012-12-04 DIAGNOSIS — A491 Streptococcal infection, unspecified site: Secondary | ICD-10-CM | POA: Diagnosis not present

## 2012-12-04 DIAGNOSIS — R5381 Other malaise: Secondary | ICD-10-CM | POA: Diagnosis not present

## 2012-12-04 DIAGNOSIS — B955 Unspecified streptococcus as the cause of diseases classified elsewhere: Secondary | ICD-10-CM

## 2012-12-04 DIAGNOSIS — Z452 Encounter for adjustment and management of vascular access device: Secondary | ICD-10-CM | POA: Diagnosis not present

## 2012-12-04 HISTORY — DX: Anemia, unspecified: D64.9

## 2012-12-04 HISTORY — DX: Other complications of anesthesia, initial encounter: T88.59XA

## 2012-12-04 HISTORY — DX: Family history of other specified conditions: Z84.89

## 2012-12-04 HISTORY — DX: Adverse effect of unspecified anesthetic, initial encounter: T41.45XA

## 2012-12-04 LAB — PROTEIN ELECTROPHORESIS, URINE REFLEX: Total Protein, Urine: 6 mg/dL

## 2012-12-04 LAB — QUANTIFERON TB GOLD ASSAY (BLOOD)
Interferon Gamma Release Assay: POSITIVE — AB
Mitogen value: 1.21 IU/mL
Quantiferon Nil Value: 0.19 IU/mL
Quantiferon Tb Ag Minus Nil Value: 0.58 IU/mL
TB Ag value: 0.77 IU/mL

## 2012-12-04 LAB — PROTEIN ELECTROPHORESIS, SERUM
Albumin ELP: 45.9 % — ABNORMAL LOW (ref 55.8–66.1)
Alpha-1-Globulin: 8.3 % — ABNORMAL HIGH (ref 2.9–4.9)
Alpha-2-Globulin: 8.7 % (ref 7.1–11.8)
Beta 2: 6.9 % — ABNORMAL HIGH (ref 3.2–6.5)
Beta Globulin: 6.6 % (ref 4.7–7.2)
Gamma Globulin: 23.6 % — ABNORMAL HIGH (ref 11.1–18.8)
Total Protein, Serum Electrophoresis: 7.1 g/dL (ref 6.0–8.3)

## 2012-12-04 LAB — CBC
Hemoglobin: 11.2 g/dL — ABNORMAL LOW (ref 12.0–15.0)
MCH: 29.6 pg (ref 26.0–34.0)
MCH: 29.6 pg (ref 26.0–34.0)
MCV: 87.6 fL (ref 78.0–100.0)
Platelets: 223 10*3/uL (ref 150–400)
Platelets: 215 10*3/uL (ref 150–400)
RBC: 3.78 MIL/uL — ABNORMAL LOW (ref 3.87–5.11)
RDW: 13.7 % (ref 11.5–15.5)
WBC: 10 10*3/uL (ref 4.0–10.5)
WBC: 8.4 10*3/uL (ref 4.0–10.5)

## 2012-12-04 LAB — BASIC METABOLIC PANEL
CO2: 23 mEq/L (ref 19–32)
Chloride: 99 mEq/L (ref 96–112)
Glucose, Bld: 153 mg/dL — ABNORMAL HIGH (ref 70–99)
Potassium: 3.7 mEq/L (ref 3.5–5.1)
Sodium: 134 mEq/L — ABNORMAL LOW (ref 135–145)

## 2012-12-04 LAB — URINE MICROSCOPIC-ADD ON

## 2012-12-04 LAB — URINALYSIS, ROUTINE W REFLEX MICROSCOPIC
Glucose, UA: NEGATIVE mg/dL
Ketones, ur: NEGATIVE mg/dL
Leukocytes, UA: NEGATIVE
Nitrite: NEGATIVE
Specific Gravity, Urine: 1.005 (ref 1.005–1.030)
pH: 6 (ref 5.0–8.0)

## 2012-12-04 LAB — HEPATIC FUNCTION PANEL
ALT: 17 U/L (ref 0–35)
AST: 24 U/L (ref 0–37)
Bilirubin, Direct: 0.1 mg/dL (ref 0.0–0.3)
Total Protein: 7.2 g/dL (ref 6.0–8.3)

## 2012-12-04 LAB — CYTOMEGALOVIRUS PCR, QUALITATIVE: CMV DNA, Qual PCR: NOT DETECTED

## 2012-12-04 LAB — CREATININE, SERUM
Creatinine, Ser: 0.87 mg/dL (ref 0.50–1.10)
GFR calc Af Amer: 79 mL/min — ABNORMAL LOW (ref >90)

## 2012-12-04 LAB — LACTIC ACID, PLASMA: Lactic Acid, Venous: 0.9 mmol/L (ref 0.5–2.2)

## 2012-12-04 MED ORDER — ASPIRIN 81 MG PO CHEW
81.0000 mg | CHEWABLE_TABLET | Freq: Every day | ORAL | Status: DC
  Administered 2012-12-04 – 2012-12-09 (×6): 81 mg via ORAL
  Filled 2012-12-04 (×6): qty 1

## 2012-12-04 MED ORDER — VANCOMYCIN HCL IN DEXTROSE 1-5 GM/200ML-% IV SOLN
1000.0000 mg | Freq: Once | INTRAVENOUS | Status: AC
  Administered 2012-12-04: 1000 mg via INTRAVENOUS
  Filled 2012-12-04: qty 200

## 2012-12-04 MED ORDER — VERAPAMIL HCL ER 240 MG PO CP24: 240.0000 mg | ORAL_CAPSULE | Freq: Every day | ORAL | Status: DC

## 2012-12-04 MED ORDER — VERAPAMIL HCL ER 240 MG PO TBCR
240.0000 mg | EXTENDED_RELEASE_TABLET | Freq: Every day | ORAL | Status: DC
  Administered 2012-12-04 – 2012-12-09 (×6): 240 mg via ORAL
  Filled 2012-12-04 (×6): qty 1

## 2012-12-04 MED ORDER — ACETAMINOPHEN 325 MG PO TABS: 650.0000 mg | ORAL_TABLET | Freq: Four times a day (QID) | ORAL | Status: DC | PRN

## 2012-12-04 MED ORDER — ASPIRIN 81 MG PO TABS: 81.0000 mg | ORAL_TABLET | Freq: Every day | ORAL | Status: DC

## 2012-12-04 MED ORDER — DOXEPIN HCL 50 MG PO CAPS
50.0000 mg | ORAL_CAPSULE | Freq: Every day | ORAL | Status: DC
  Administered 2012-12-04 – 2012-12-08 (×5): 50 mg via ORAL
  Filled 2012-12-04 (×7): qty 1

## 2012-12-04 MED ORDER — ACETAMINOPHEN 650 MG RE SUPP: 650.0000 mg | Freq: Four times a day (QID) | RECTAL | Status: DC | PRN

## 2012-12-04 MED ORDER — ONDANSETRON HCL 4 MG PO TABS: 4.0000 mg | ORAL_TABLET | Freq: Four times a day (QID) | ORAL | Status: DC | PRN

## 2012-12-04 MED ORDER — HEPARIN SODIUM (PORCINE) 5000 UNIT/ML IJ SOLN
5000.0000 [IU] | Freq: Three times a day (TID) | INTRAMUSCULAR | Status: DC
  Administered 2012-12-04 – 2012-12-09 (×15): 5000 [IU] via SUBCUTANEOUS
  Filled 2012-12-04 (×18): qty 1

## 2012-12-04 MED ORDER — SODIUM CHLORIDE 0.9 % IV SOLN
INTRAVENOUS | Status: DC
  Administered 2012-12-04 – 2012-12-08 (×3): via INTRAVENOUS

## 2012-12-04 MED ORDER — VANCOMYCIN HCL 500 MG IV SOLR
500.0000 mg | Freq: Two times a day (BID) | INTRAVENOUS | Status: DC
  Administered 2012-12-04 – 2012-12-05 (×3): 500 mg via INTRAVENOUS
  Filled 2012-12-04 (×6): qty 500

## 2012-12-04 MED ORDER — ONDANSETRON HCL 4 MG/2ML IJ SOLN: 4.0000 mg | Freq: Four times a day (QID) | INTRAMUSCULAR | Status: DC | PRN

## 2012-12-04 NOTE — ED Notes (Signed)
Pt sent by Dr Ilsa Iha for further eval of bacteriemia. Pt has had fever 101.0 since July. Per Dr Ilsa Iha, request pt to be evaluated and admitted.

## 2012-12-04 NOTE — Care Management Note (Signed)
    Page 1 of 2   12/09/2012     10:11:06 AM   CARE MANAGEMENT NOTE 12/09/2012  Patient:  Lisa Cabrera, Lisa Cabrera   Account Number:  [id number]  Date Initiated:  12/04/2012  Documentation initiated by:  Letha Cape  Subjective/Objective Assessment:   dx bactremia  admit- lives with spouse.     Action/Plan:   Anticipated DC Date:  12/09/2012   Anticipated DC Plan:  HOME W HOME HEALTH SERVICES      DC Planning Services  CM consult      [facility] Choice  HOME HEALTH   Choice offered to / List presented to:  C-1 Patient        HH arranged  HH-1 RN      [facility] agency  [facility]   Status of service:  Completed, signed off Medicare Important Message given?   (If response is "NO", the following Medicare IM given date fields will be blank) Date Medicare IM given:   Date Additional Medicare IM given:    Discharge Disposition:  HOME W HOME HEALTH SERVICES  Per UR Regulation:  Reviewed for med. necessity/level of care/duration of stay  If discussed at Long Length of Stay Meetings, dates discussed:    Comments:  12/09/12 10:06 Letha Cape RN, BSN [phone] patient for dc today, notified [facility], patient is to go home with iv abx.  Patient has medication coverage and transportation at dc.  12/08/12  10:56 Letha Cape RN, BSN [phone] patient will need 4 weeks of Rocephin, patient chose [facility], referral made to [facility], Lupita Leash notifired.  We are awaiting cx's to come back  neg so we can put the picc line in.  Soc will begin 24-48 hrs post discharge.  12/04/12 16:40 Letha Cape RN,BSN [phone] patient lives with spouse, NCM will follow for dc needs.

## 2012-12-04 NOTE — H&P (Signed)
[facility] History and Physical  Lisa Cabrera ZOX:[id number] DOB: November 04, 1945 DOA: 12/04/2012  Referring physician: Dr  PCP: Daisy Floro, MD  Specialists: ID  Chief Complaint: Positive Blood cultures.  HPI: Lisa Cabrera is a 67 y.o. female  67 year old history of a murmur, spinal surgery, has had 2 bouts of illness in the last 6 months initial 1 was treated with azithromycin and the fevers went away, the second one in July she was treated with Levaquin which improved for one week and then after that the fevers and fatigue returned showing she was referred to the infectious disease doctor during this time she was having temperatures of 101 on a daily basis usually in the evening. She usually wakes up sweating takes Tylenol with improvement of her fevers. Mild unintentional weight loss and a dry cough. A PPD was done which was negative, chest x-ray and ANA were negative. The infectious disease doctor get blood cultures and 12/03/2003 one out of two blood cultures came back gram-positive cocci in chains.Marland Kitchen SPEP was negative, ESR and CRP were elevated CMV titers were negative UA did not show any infection  In the ED: Blood cultures were repeated she was started on vancomycin empirically, CBC shows white count 10.0, mild hyponatremia and anemia so we are consulted for further evaluation.  Review of Systems: The patient denies anorexia, , vision loss, decreased hearing, hoarseness, chest pain, syncope, dyspnea on exertion, peripheral edema, balance deficits, hemoptysis, abdominal pain, melena, hematochezia, severe indigestion/heartburn, hematuria, incontinence, genital sores, muscle weakness, suspicious skin lesions, transient blindness, difficulty walking, depression, unusual weight change, abnormal bleeding, enlarged lymph nodes, angioedema, and breast masses.    Past Medical History  Diagnosis Date  . H/O seasonal allergies   . Heart murmur    Past Surgical History  Procedure  Laterality Date  . Abdominal hysterectomy      1997  . Spine surgery     Social History:  reports that she has never smoked. She does not have any smokeless tobacco history on file. She reports that  drinks alcohol. She reports that she does not use illicit drugs. Was at home with husband  No Known Allergies  Family History  Problem Relation Age of Onset  . Dementia Mother   . Heart attack Mother   . Stroke Father   . Kidney disease Father   . Cancer Sister     ovarian    Prior to Admission medications   Medication Sig Start Date End Date Taking? Authorizing Provider  aspirin 81 MG tablet Take 81 mg by mouth daily.   Yes Historical Provider, MD  Cholecalciferol (VITAMIN D) 2000 UNITS CAPS Take by mouth daily.   Yes Historical Provider, MD  doxepin (SINEQUAN) 50 MG capsule Take 50 mg by mouth at bedtime.   Yes Historical Provider, MD  Multiple Vitamins-Minerals (MULTIVITAMIN PO) Take by mouth daily.   Yes Historical Provider, MD  Omega-3 Fatty Acids (FISH OIL PO) Take by mouth daily.   Yes Historical Provider, MD  verapamil (VERELAN PM) 240 MG 24 hr capsule Take 240 mg by mouth daily.   Yes Historical Provider, MD   Physical Exam: Filed Vitals:   12/04/12 1200  BP: 126/63  Pulse: 88  Temp:   Resp:     BP 126/63  Pulse 88  Temp(Src) 98.2 F (36.8 C) (Oral)  Resp 16  Ht 5\' 1"  (1.549 m)  Wt 51.71 kg (114 lb)  BMI 21.55 kg/m2  SpO2 95%  General Appearance:  Alert, cooperative, no distress, appears stated age  Head:    Normocephalic, without obvious abnormality, atraumatic           Throat:   Lips, mucosa, and tongue normal;  Neck:   Supple, symmetrical, trachea midline, no adenopathy;       Lungs:     Clear to auscultation bilaterally, respirations unlabored  Chest Wall:    No tenderness or deformity   Heart:    Regular rate and rhythm, S1 and S2 normal, no murmur, rub   or gallop     Abdomen:     Soft, non-tender, bowel sounds active all four quadrants,     no masses, no organomegaly        Extremities:   Extremities normal, atraumatic, no cyanosis or edema  Pulses:   2+ and symmetric all extremities  Skin:   Skin color, texture, turgor normal, no rashes or lesions  Lymph nodes:   Cervical, supraclavicular, and axillary nodes normal  Neurologic:   CNII-XII intact, normal strength, sensation and reflexes    throughout     Labs on Admission:  Basic Metabolic Panel:  Recent Labs Lab 12/02/12 1808 12/04/12 0905  NA 135 134*  K 3.9 3.7  CL 100 99  CO2 25 23  GLUCOSE 100* 153*  BUN 13 12  CREATININE 0.84 0.89  CALCIUM 9.0 9.3   Liver Function Tests:  Recent Labs Lab 12/02/12 1808 12/04/12 0905  AST 25 24  ALT 19 17  ALKPHOS 69 69  BILITOT 0.6 0.5  PROT 7.1 7.2  ALBUMIN 3.5 2.8*   No results found for this basename: LIPASE, AMYLASE,  in the last 168 hours No results found for this basename: AMMONIA,  in the last 168 hours CBC:  Recent Labs Lab 12/02/12 1808 12/04/12 0905  WBC 9.5 10.0  NEUTROABS 7.4  --   HGB 11.5* 11.2*  HCT 34.0* 33.1*  MCV 85.9 87.6  PLT 246 223   Cardiac Enzymes: No results found for this basename: CKTOTAL, CKMB, CKMBINDEX, TROPONINI,  in the last 168 hours  BNP (last 3 results) No results found for this basename: PROBNP,  in the last 8760 hours CBG: No results found for this basename: GLUCAP,  in the last 168 hours  Radiological Exams on Admission: Dg Chest 2 View  12/04/2012   *RADIOLOGY REPORT*  Clinical Data: Fever  CHEST - 2 VIEW  Comparison: 11/19/2012  Findings: Moderate dextroscoliosis of the thoracic spine.  The lungs are clear without infiltrate or effusion.  Negative for heart failure.  No change from the prior study.  IMPRESSION: No active cardiopulmonary abnormality.   Original Report Authenticated By: Janeece Riggers, M.D.    EKG: Independently reviewed.   Assessment/Plan Principal Problem: Bacteremia/  Fever - I agree with repeating blood cultures, I will continue  vancomycin per pharmacy. Go ahead and get a 2-D echo admit her to med-surg. - Consult Infectious disease. Use Tylenol for fevers.  Code Status: full Family Communication: husband Disposition Plan: inpatient  Time spent: 80 minutes  Marinda Elk [facility] Pager [phone]  If 7PM-7AM, please contact night-coverage [email or web address] Password [email or web address] 12/04/2012, 12:22 PM

## 2012-12-04 NOTE — ED Provider Notes (Signed)
CSN: [id number]     Arrival date & time 12/04/12  0830 History   First MD Initiated Contact with Patient 12/04/12 [phone]     Chief Complaint  Patient presents with  . Blood Infection   (Consider location/radiation/quality/duration/timing/severity/associated sxs/prior Treatment) HPI Comments: Seen by her PCP for FUO for 2 months. Blood culture last night came back with Gram positive GPCs. Sent here for admission.   Patient is a 67 y.o. female presenting with fever. The history is provided by the patient.  Fever Max temp prior to arrival:  ~102 Temp source:  Oral Severity:  Moderate Duration:  60 days Timing:  Constant Progression:  Worsening Chronicity:  New Relieved by:  Nothing Worsened by:  Nothing tried Ineffective treatments:  None tried Associated symptoms: myalgias (occasional muscle pains)   Associated symptoms: no chest pain, no chills, no cough, no diarrhea, no ear pain, no headaches, no nausea, no rash, no rhinorrhea and no vomiting     Past Medical History  Diagnosis Date  . H/O seasonal allergies   . Heart murmur    Past Surgical History  Procedure Laterality Date  . Abdominal hysterectomy      1997  . Spine surgery     Family History  Problem Relation Age of Onset  . Dementia Mother   . Stroke Father   . Kidney disease Father   . Cancer Sister     ovarian   History  Substance Use Topics  . Smoking status: Never Smoker   . Smokeless tobacco: Not on file  . Alcohol Use: Yes     Comment: occasionally   OB History   Grav Para Term Preterm Abortions TAB SAB Ect Mult Living                 Review of Systems  Constitutional: Positive for fever. Negative for chills.  HENT: Negative for ear pain and rhinorrhea.   Respiratory: Negative for cough.   Cardiovascular: Negative for chest pain.  Gastrointestinal: Negative for nausea, vomiting and diarrhea.  Musculoskeletal: Positive for myalgias (occasional muscle pains).  Skin: Negative for rash.   Neurological: Negative for headaches.  All other systems reviewed and are negative.    Allergies  Review of patient's allergies indicates no known allergies.  Home Medications   Current Outpatient Rx  Name  Route  Sig  Dispense  Refill  . aspirin 81 MG tablet   Oral   Take 81 mg by mouth daily.         . Cholecalciferol (VITAMIN D) 2000 UNITS CAPS   Oral   Take by mouth daily.         Marland Kitchen doxepin (SINEQUAN) 50 MG capsule   Oral   Take 50 mg by mouth at bedtime.         . Multiple Vitamins-Minerals (MULTIVITAMIN PO)   Oral   Take by mouth daily.         . Omega-3 Fatty Acids (FISH OIL PO)   Oral   Take by mouth daily.         . verapamil (VERELAN PM) 240 MG 24 hr capsule   Oral   Take 240 mg by mouth daily.          BP 130/63  Pulse 106  Temp(Src) 99.3 F (37.4 C) (Oral)  Resp 22  Ht 5\' 1"  (1.549 m)  Wt 114 lb (51.71 kg)  BMI 21.55 kg/m2  SpO2 95% Physical Exam  Nursing note and vitals reviewed. Constitutional: She  is oriented to person, place, and time. She appears well-developed and well-nourished. No distress.  HENT:  Head: Normocephalic and atraumatic.  Eyes: EOM are normal. Pupils are equal, round, and reactive to light.  Neck: Normal range of motion. Neck supple.  Cardiovascular: Normal rate and regular rhythm.  Exam reveals no friction rub.   No murmur heard. Pulmonary/Chest: Effort normal and breath sounds normal. No respiratory distress. She has no wheezes. She has no rales.  Abdominal: Soft. She exhibits no distension. There is no tenderness. There is no rebound.  Musculoskeletal: Normal range of motion. She exhibits no edema.  Neurological: She is alert and oriented to person, place, and time.  Skin: She is not diaphoretic.    ED Course  Procedures (including critical care time) Labs Review Labs Reviewed  CBC - Abnormal; Notable for the following:    RBC 3.78 (*)    Hemoglobin 11.2 (*)    HCT 33.1 (*)    All other components  within normal limits  BASIC METABOLIC PANEL - Abnormal; Notable for the following:    Sodium 134 (*)    Glucose, Bld 153 (*)    GFR calc non Af Amer 66 (*)    GFR calc Af Amer 77 (*)    All other components within normal limits  URINALYSIS, ROUTINE W REFLEX MICROSCOPIC - Abnormal; Notable for the following:    Hgb urine dipstick TRACE (*)    All other components within normal limits  HEPATIC FUNCTION PANEL - Abnormal; Notable for the following:    Albumin 2.8 (*)    All other components within normal limits  CBC - Abnormal; Notable for the following:    RBC 3.79 (*)    Hemoglobin 11.2 (*)    HCT 33.2 (*)    All other components within normal limits  CREATININE, SERUM - Abnormal; Notable for the following:    GFR calc non Af Amer 68 (*)    GFR calc Af Amer 79 (*)    All other components within normal limits  CULTURE, BLOOD (ROUTINE X 2)  CULTURE, BLOOD (ROUTINE X 2)  URINE CULTURE  CULTURE, BLOOD (ROUTINE X 2)  CULTURE, BLOOD (ROUTINE X 2)  LACTIC ACID, PLASMA  URINE MICROSCOPIC-ADD ON   Imaging Review Dg Chest 2 View  12/04/2012   *RADIOLOGY REPORT*  Clinical Data: Fever  CHEST - 2 VIEW  Comparison: 11/19/2012  Findings: Moderate dextroscoliosis of the thoracic spine.  The lungs are clear without infiltrate or effusion.  Negative for heart failure.  No change from the prior study.  IMPRESSION: No active cardiopulmonary abnormality.   Original Report Authenticated By: Janeece Riggers, M.D.    MDM   1. Bacteremia   2. Fever    30F presents with positive blood culture. FUO for 2 months - extensive workup by PCP and ID. Has been on Azithromycin and Levaquin during this 2 month period. Will do labs, give Vanc - will admit.    Dagmar Hait, MD 12/04/12 [phone]

## 2012-12-04 NOTE — ED Notes (Signed)
Admitting MD at bedside.

## 2012-12-04 NOTE — Progress Notes (Signed)
ANTIBIOTIC CONSULT NOTE   Pharmacy Consult for vancomycin Indication: bacteremia  No Known Allergies  Patient Measurements: Height: 5\' 1"  (154.9 cm) Weight: 114 lb (51.71 kg) IBW/kg (Calculated) : 47.8  Vital Signs: Temp: 98.3 F (36.8 C) (09/05 1034) Temp src: Oral (09/05 1034) BP: 134/76 mmHg (09/05 1034) Pulse Rate: 95 (09/05 1034) Intake/Output from previous day:   Intake/Output from this shift:    Labs:  Recent Labs  12/02/12 1808 12/04/12 0905  WBC 9.5 10.0  HGB 11.5* 11.2*  PLT 246 223  CREATININE 0.84 0.89   Estimated Creatinine Clearance: 46.9 ml/min (by C-G formula based on Cr of 0.89). No results found for this basename: VANCOTROUGH, Leodis Binet, VANCORANDOM, GENTTROUGH, GENTPEAK, GENTRANDOM, TOBRATROUGH, TOBRAPEAK, TOBRARND, AMIKACINPEAK, AMIKACINTROU, AMIKACIN,  in the last 72 hours   Microbiology: Recent Results (from the past 720 hour(s))  CULTURE, BLOOD (SINGLE)     Status: None   Collection Time    12/02/12  6:10 PM      Result Value Range Status   Preliminary Report GRAM POSITIVE COCCI IN CHAINS   Preliminary  CULTURE, BLOOD (SINGLE)     Status: None   Collection Time    12/02/12  6:24 PM      Result Value Range Status   Preliminary Report GRAM POSITIVE COCCI IN CHAINS   Preliminary    Medical History: Past Medical History  Diagnosis Date  . H/O seasonal allergies   . Heart murmur    Assessment: 67 year old female with GPC bacteremia noted on blood cultures drawn 9/4. Patient is currently off antibiotics but has taken two course of levaquin in July and August. No fevers noted in ED and wbc is normal at 10. Patient was loaded with IV vancomycin in ED. Renal function is normal.  Goal of Therapy:  Vancomycin trough level 15-20 mcg/ml  Plan:  Measure antibiotic drug levels at steady state Follow up culture results Vancomycin 500mg  IV q 12 hours Follow renal function for dose adjustments  Sheppard Coil PharmD., BCPS Clinical  Pharmacist Pager [phone] 12/04/2012 11:26 AM

## 2012-12-04 NOTE — Progress Notes (Deleted)
*  Preliminary Results* Bilateral lower extremity venous duplex completed. Bilateral lower extremities are negative for deep vein thrombosis. There is no evidence of Baker's cyst bilaterally.  12/04/2012  Gertie Fey, RVT, RDCS, RDMS

## 2012-12-04 NOTE — Progress Notes (Signed)
Utilization review completed. Yamna Mackel, RN, BSN. 

## 2012-12-04 NOTE — Progress Notes (Signed)
Lisa Cabrera [id number] Admission Data: 12/04/2012 1:19 PM Attending Provider: Marinda Elk, MD  WUJ:[id number] Hessie Diener, MD Consults/ Treatment Team:    Lisa Cabrera is a 67 y.o. female patient admitted from ED with bactremia awake, alert  & orientated  X 3,  Full Code, VSS - Blood pressure 126/63, pulse 88, temperature 98.2 F (36.8 C), temperature source Oral, resp. rate 16, height 5\' 1"  (1.549 m), weight 51.71 kg (114 lb), SpO2 95.00%., , no c/o shortness of breath, no c/o chest pain, no distress noted.    IV site WDL:  antecubital left, condition patent and no redness with a transparent dsg that's clean dry and intact.  Allergies:  No Known Allergies   Past Medical History  Diagnosis Date  . H/O seasonal allergies   . Heart murmur     History:  obtained from the patient. Tobacco/alcohol: denied   Pt orientation to unit, room and routine. Information packet given to patient/family and safety video watched.  Admission INP armband ID verified with patient/family, and in place. SR up x 2, fall risk assessment complete with Patient and family verbalizing understanding of risks associated with falls. Pt verbalizes an understanding of how to use the call bell and to call for help before getting out of bed.  Skin, clean-dry- intact without evidence of bruising, or skin tears.   No evidence of skin break down noted on exam. Pt is independent and up ad lib     Will cont to monitor and assist as needed.  Cindra Eves, RN 12/04/2012 1:19 PM

## 2012-12-04 NOTE — Progress Notes (Signed)
Echocardiogram 2D Echocardiogram has been performed.  Lisa Cabrera 12/04/2012, 3:59 PM

## 2012-12-05 DIAGNOSIS — I341 Nonrheumatic mitral (valve) prolapse: Secondary | ICD-10-CM | POA: Diagnosis present

## 2012-12-05 DIAGNOSIS — I059 Rheumatic mitral valve disease, unspecified: Secondary | ICD-10-CM | POA: Diagnosis not present

## 2012-12-05 DIAGNOSIS — A491 Streptococcal infection, unspecified site: Secondary | ICD-10-CM

## 2012-12-05 DIAGNOSIS — D649 Anemia, unspecified: Secondary | ICD-10-CM | POA: Diagnosis not present

## 2012-12-05 DIAGNOSIS — R7881 Bacteremia: Secondary | ICD-10-CM | POA: Diagnosis not present

## 2012-12-05 DIAGNOSIS — R509 Fever, unspecified: Secondary | ICD-10-CM | POA: Diagnosis not present

## 2012-12-05 DIAGNOSIS — B955 Unspecified streptococcus as the cause of diseases classified elsewhere: Secondary | ICD-10-CM | POA: Diagnosis present

## 2012-12-05 DIAGNOSIS — R739 Hyperglycemia, unspecified: Secondary | ICD-10-CM | POA: Diagnosis present

## 2012-12-05 LAB — COMPREHENSIVE METABOLIC PANEL
BUN: 14 mg/dL (ref 6–23)
Calcium: 8.7 mg/dL (ref 8.4–10.5)
GFR calc Af Amer: 73 mL/min — ABNORMAL LOW (ref >90)
Glucose, Bld: 99 mg/dL (ref 70–99)
Total Protein: 6.6 g/dL (ref 6.0–8.3)

## 2012-12-05 LAB — URINE CULTURE
Colony Count: NO GROWTH
Culture: NO GROWTH

## 2012-12-05 LAB — CBC
HCT: 30.9 % — ABNORMAL LOW (ref 36.0–46.0)
Hemoglobin: 10.4 g/dL — ABNORMAL LOW (ref 12.0–15.0)
Platelets: 215 10*3/uL (ref 150–400)
RBC: 3.5 MIL/uL — ABNORMAL LOW (ref 3.87–5.11)
RDW: 14.1 % (ref 11.5–15.5)
WBC: 7.1 10*3/uL (ref 4.0–10.5)

## 2012-12-05 LAB — PROTIME-INR: Prothrombin Time: 15.1 seconds (ref 11.6–15.2)

## 2012-12-05 NOTE — Progress Notes (Signed)
CRITICAL VALUE ALERT  Critical value received:  Positive blood cultures- gram + cocci in chains  Date of notification:  12/05/2012  Time of notification:  8:27 PM  Critical value read back:yes  Nurse who received alert:  Cecile Hearing  MD notified (1st page):  M. Lynch  Time of first page:  8:29 PM  MD notified (2nd page):  Time of second page:  Responding MD:  M. Lynch  Time MD responded:  8:29 PM

## 2012-12-05 NOTE — Progress Notes (Signed)
Patient ID: Lisa Cabrera, female   DOB: 12/21/1945, 67 y.o.   MRN: [id number]         [facility]    Date of Admission:  12/04/2012           Day 2 vancomycin  Principal Problem:   Streptococcal bacteremia Active Problems:   Normocytic anemia   Hyperglycemia   Myxomatous mitral valve   . aspirin  81 mg Oral Daily  . doxepin  50 mg Oral QHS  . heparin  5,000 Units Subcutaneous Q8H  . vancomycin  500 mg Intravenous Q12H  . verapamil  240 mg Oral Daily    Subjective: She is feeling about the same. She had mild night sweats last night. Review of Systems: Pertinent items are noted in HPI.  Past Medical History  Diagnosis Date  . H/O seasonal allergies   . Heart murmur   . Complication of anesthesia     HAD RAPID HEART BEAT AFTER ANESTHIA  . Family history of anesthesia complication     Father had difficulty waking up    History  Substance Use Topics  . Smoking status: Never Smoker   . Smokeless tobacco: Never Used  . Alcohol Use: Yes     Comment: occasionally    Family History  Problem Relation Age of Onset  . Dementia Mother   . Heart attack Mother   . Stroke Father   . Kidney disease Father   . Cancer Sister     ovarian    No Known Allergies  Objective: Temp:  [98.9 F (37.2 C)-100.6 F (38.1 C)] 98.9 F (37.2 C) (09/06 0513) Pulse Rate:  [88-94] 88 (09/06 0513) Resp:  [18] 18 (09/05 2104) BP: (112-121)/(71) 112/71 mmHg (09/06 0513) SpO2:  [95 %-96 %] 96 % (09/06 0513)  General: She is alert and in no distress Skin: I agree with Dr. 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 There is a small nontender erythematous nodule. She also has some scattered petechial/telangiectasia nodules. There is one on her right anterior thigh, one on her right forearm and several on her upper chest and back. I do not see any splinter or conjunctival hemorrhages Lungs: Clear Cor: Regular S1 and S2 with occasional  premature beats. I do not hear a murmur Abdomen: Soft nontender with no palpable masses Joints and extremities: Normal Neuro: Alert with normal speech and conversation Mood and affect: Normal  Lab Results Lab Results  Component Value Date   WBC 7.1 12/05/2012   HGB 10.4* 12/05/2012   HCT 30.9* 12/05/2012   MCV 88.3 12/05/2012   PLT 215 12/05/2012    Lab Results  Component Value Date   CREATININE 0.93 12/05/2012   BUN 14 12/05/2012   NA 137 12/05/2012   K 4.0 12/05/2012   CL 103 12/05/2012   CO2 25 12/05/2012    Lab Results  Component Value Date   ALT 16 12/05/2012   AST 25 12/05/2012   ALKPHOS 66 12/05/2012   BILITOT 0.3 12/05/2012      Microbiology: Recent Results (from the past 240 hour(s))  CULTURE, BLOOD (SINGLE)     Status: None   Collection Time    12/02/12  6:10 PM      Result Value Range Status   Preliminary Report STREPTOCOCCUS SPECIES   Preliminary  CULTURE, BLOOD (SINGLE)     Status: None   Collection Time    12/02/12  6:24 PM  Result Value Range Status   Preliminary Report STREPTOCOCCUS SPECIES   Preliminary  CULTURE, BLOOD (ROUTINE X 2)     Status: None   Collection Time    12/04/12  8:58 AM      Result Value Range Status   Specimen Description BLOOD RIGHT ARM   Final   Special Requests BOTTLES DRAWN AEROBIC ONLY 10CC   Final   Culture  Setup Time     Final   Value: 12/04/2012 16:27     Performed at [facility]   Culture     Final   Value:        BLOOD CULTURE RECEIVED NO GROWTH TO DATE CULTURE WILL BE HELD FOR 5 DAYS BEFORE ISSUING A FINAL NEGATIVE REPORT     Performed at [facility]   Report Status PENDING   Incomplete  CULTURE, BLOOD (ROUTINE X 2)     Status: None   Collection Time    12/04/12  9:05 AM      Result Value Range Status   Specimen Description BLOOD HAND RIGHT   Final   Special Requests BOTTLES DRAWN AEROBIC ONLY 5CC   Final   Culture  Setup Time     Final   Value: 12/04/2012 16:27     Performed at [facility]   Culture      Final   Value:        BLOOD CULTURE RECEIVED NO GROWTH TO DATE CULTURE WILL BE HELD FOR 5 DAYS BEFORE ISSUING A FINAL NEGATIVE REPORT     Performed at [facility]   Report Status PENDING   Incomplete  URINE CULTURE     Status: None   Collection Time    12/04/12  9:54 AM      Result Value Range Status   Specimen Description URINE, CLEAN CATCH   Final   Special Requests NONE   Final   Culture  Setup Time     Final   Value: 12/04/2012 10:22     Performed at [facility] Count     Final   Value: NO GROWTH     Performed at [facility]   Culture     Final   Value: NO GROWTH     Performed at [facility]   Report Status 12/05/2012 FINAL   Final    Studies/Results: Dg Chest 2 View  12/04/2012   *RADIOLOGY REPORT*  Clinical Data: Fever  CHEST - 2 VIEW  Comparison: 11/19/2012  Findings: Moderate dextroscoliosis of the thoracic spine.  The lungs are clear without infiltrate or effusion.  Negative for heart failure.  No change from the prior study.  IMPRESSION: No active cardiopulmonary abnormality.   Original Report Authenticated By: Janeece Riggers, M.D.   TTE 12/04/12: Study Conclusions  - Left ventricle: The cavity size was normal. Wall thickness was normal. Systolic function was normal. The estimated ejection fraction was in the range of 55% to 60%. Wall motion was normal; there were no regional wall motion abnormalities. Doppler parameters are consistent with abnormal left ventricular relaxation (grade 1 diastolic dysfunction). - Mitral valve: Moderate thickening, consistent with myxomatous proliferation. Moderate prolapse, involving the anterior leaflet. Mild regurgitation. - Left atrium: The atrium was mildly dilated. - Pericardium, extracardiac: A trivial pericardial effusion was identified.   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 I would set up a  transesophageal echocardiogram early next  week. I will continue vancomycin pending repeat blood cultures. I would hold off on having a PICC placed until we know she has negative blood cultures. If there is no clear evidence of endocarditis on the TEE I will consider an abdominal CT scan looking for liver abscess or other source of infection.  She has had negative PPD skin test in the past including one in the past 3 weeks and no known exposures to anyone with active tuberculosis. Her gamma interferon release assay (Quantiferon) here was positive suggesting the possibility of latent tuberculosis. I do not see anything to suggest active tuberculosis as the cause of her fever of unknown origin and would hold off for now any decision as to the need for treatment of latent tuberculosis.  Plan: 1. Continue vancomycin 2. Await results of repeat blood cultures and final results of initial blood cultures 3. Recommend transesophageal echocardiogram 4. Hold off on PICC placement until we know blood cultures are negative  Cliffton Asters, MD [facility] [phone] pager   [phone] cell 12/05/2012, 3:34 PM

## 2012-12-05 NOTE — Progress Notes (Addendum)
[facility] PROGRESS NOTE  Lisa Cabrera UJW:[id number] DOB: 1945-12-29 DOA: 12/04/2012 PCP: Daisy Floro, MD  Assessment/Plan:  Principal Problem:   Streptococcal bacteremia:  Sensitivities pending. Continue vancomycin Active Problems:   Myxomatous mitral valve with prolapse: No vegetation noted on transthoracic echocardiogram, but certainly is concerning for etiology of bacteremia (i.e. Endocarditis). May need transesophageal echocardiogram. Will discuss with infectious disease. Patient reports red spots on her hands all summer, which are nontender; has what looks like a Janeway lesion on her left palm.   Normocytic anemia   Hyperglycemia positive interferon gamma release assay: Chest x-ray negative. Will discuss with infectious disease  Code Status: full Family Communication: husband at bedside Disposition Plan: home   Consultants:  ID  Procedures:    Antibiotics:  vanc 9/5  HPI/Subjective: Has had weight loss of 8 pounds. Poor appetite since her fevers started back in May. Has had occasional cough. None currently. Reports red spots on her hands over the summer.  Objective: Filed Vitals:   12/05/12 0513  BP: 112/71  Pulse: 88  Temp: 98.9 F (37.2 C)  Resp:     Intake/Output Summary (Last 24 hours) at 12/05/12 1433 Last data filed at 12/05/12 0948  Gross per 24 hour  Intake    120 ml  Output      0 ml  Net    120 ml   Filed Weights   12/04/12 0836 12/04/12 1300  Weight: 51.71 kg (114 lb) 51.3 kg (113 lb 1.5 oz)    Exam:   General:  Alert, oriented. Well-nourished. Comfortable. Nontoxic.  Cardiovascular: Regular rate rhythm. I could not appreciate a murmur.  Respiratory: Clear to auscultation bilaterally without wheeze rhonchi or rale  Abdomen: Soft nontender nondistended  Extremities: No edema. Small erythematous papule, nontender, on left palm  Data Reviewed: Basic Metabolic Panel:  Recent Labs Lab 12/02/12 1808 12/04/12 0905  12/04/12 1300 12/05/12 0509  NA 135 134*  --  137  K 3.9 3.7  --  4.0  CL 100 99  --  103  CO2 25 23  --  25  GLUCOSE 100* 153*  --  99  BUN 13 12  --  14  CREATININE 0.84 0.89 0.87 0.93  CALCIUM 9.0 9.3  --  8.7   Liver Function Tests:  Recent Labs Lab 12/02/12 1808 12/04/12 0905 12/05/12 0509  AST 25 24 25   ALT 19 17 16   ALKPHOS 69 69 66  BILITOT 0.6 0.5 0.3  PROT 7.1 7.2 6.6  ALBUMIN 3.5 2.8* 2.5*   No results found for this basename: LIPASE, AMYLASE,  in the last 168 hours No results found for this basename: AMMONIA,  in the last 168 hours CBC:  Recent Labs Lab 12/02/12 1808 12/04/12 0905 12/04/12 1300 12/05/12 0509  WBC 9.5 10.0 8.4 7.1  NEUTROABS 7.4  --   --   --   HGB 11.5* 11.2* 11.2* 10.4*  HCT 34.0* 33.1* 33.2* 30.9*  MCV 85.9 87.6 87.6 88.3  PLT 246 223 215 215   Cardiac Enzymes: No results found for this basename: CKTOTAL, CKMB, CKMBINDEX, TROPONINI,  in the last 168 hours BNP (last 3 results) No results found for this basename: PROBNP,  in the last 8760 hours CBG: No results found for this basename: GLUCAP,  in the last 168 hours  Recent Results (from the past 240 hour(s))  CULTURE, BLOOD (SINGLE)     Status: None   Collection Time    12/02/12  6:10 PM  Result Value Range Status   Preliminary Report STREPTOCOCCUS SPECIES   Preliminary  CULTURE, BLOOD (SINGLE)     Status: None   Collection Time    12/02/12  6:24 PM      Result Value Range Status   Preliminary Report STREPTOCOCCUS SPECIES   Preliminary  CULTURE, BLOOD (ROUTINE X 2)     Status: None   Collection Time    12/04/12  8:58 AM      Result Value Range Status   Specimen Description BLOOD RIGHT ARM   Final   Special Requests BOTTLES DRAWN AEROBIC ONLY 10CC   Final   Culture  Setup Time     Final   Value: 12/04/2012 16:27     Performed at [facility]   Culture     Final   Value:        BLOOD CULTURE RECEIVED NO GROWTH TO DATE CULTURE WILL BE HELD FOR 5 DAYS BEFORE  ISSUING A FINAL NEGATIVE REPORT     Performed at [facility]   Report Status PENDING   Incomplete  CULTURE, BLOOD (ROUTINE X 2)     Status: None   Collection Time    12/04/12  9:05 AM      Result Value Range Status   Specimen Description BLOOD HAND RIGHT   Final   Special Requests BOTTLES DRAWN AEROBIC ONLY 5CC   Final   Culture  Setup Time     Final   Value: 12/04/2012 16:27     Performed at [facility]   Culture     Final   Value:        BLOOD CULTURE RECEIVED NO GROWTH TO DATE CULTURE WILL BE HELD FOR 5 DAYS BEFORE ISSUING A FINAL NEGATIVE REPORT     Performed at [facility]   Report Status PENDING   Incomplete  URINE CULTURE     Status: None   Collection Time    12/04/12  9:54 AM      Result Value Range Status   Specimen Description URINE, CLEAN CATCH   Final   Special Requests NONE   Final   Culture  Setup Time     Final   Value: 12/04/2012 10:22     Performed at [facility] Count     Final   Value: NO GROWTH     Performed at [facility]   Culture     Final   Value: NO GROWTH     Performed at [facility]   Report Status 12/05/2012 FINAL   Final     Studies: Dg Chest 2 View  12/04/2012   *RADIOLOGY REPORT*  Clinical Data: Fever  CHEST - 2 VIEW  Comparison: 11/19/2012  Findings: Moderate dextroscoliosis of the thoracic spine.  The lungs are clear without infiltrate or effusion.  Negative for heart failure.  No change from the prior study.  IMPRESSION: No active cardiopulmonary abnormality.   Original Report Authenticated By: Janeece Riggers, M.D.    Scheduled Meds: . aspirin  81 mg Oral Daily  . doxepin  50 mg Oral QHS  . heparin  5,000 Units Subcutaneous Q8H  . vancomycin  500 mg Intravenous Q12H  . verapamil  240 mg Oral Daily   Continuous Infusions: . sodium chloride 10 mL/hr at 12/04/12 1257   Echo Left ventricle: The cavity size was normal. Wall thickness was normal. Systolic function was normal. The  estimated ejection fraction was in the  range of 55% to 60%. Wall motion was normal; there were no regional wall motion abnormalities. Doppler parameters are consistent with abnormal left ventricular relaxation (grade 1 diastolic dysfunction). - Mitral valve: Moderate thickening, consistent with myxomatous proliferation. Moderate prolapse, involving the anterior leaflet. Mild regurgitation. - Left atrium: The atrium was mildly dilated. - Pericardium, extracardiac: A trivial pericardial effusion was identified. Impressions:  - Mitral valve is myxomatous with MVP; no obvious vegetation; suggest TEE to further evaluate if clincially indicated.  Time spent: 35 minutes  Ieisha Gao L  [facility] Pager [phone]. If 7PM-7AM, please contact night-coverage at [email or web address], password [facility] 12/05/2012, 2:33 PM  LOS: 1 day

## 2012-12-06 DIAGNOSIS — R7881 Bacteremia: Secondary | ICD-10-CM | POA: Diagnosis not present

## 2012-12-06 DIAGNOSIS — R509 Fever, unspecified: Secondary | ICD-10-CM | POA: Diagnosis not present

## 2012-12-06 DIAGNOSIS — I059 Rheumatic mitral valve disease, unspecified: Secondary | ICD-10-CM | POA: Diagnosis not present

## 2012-12-06 DIAGNOSIS — A491 Streptococcal infection, unspecified site: Secondary | ICD-10-CM | POA: Diagnosis not present

## 2012-12-06 MED ORDER — VANCOMYCIN HCL IN DEXTROSE 750-5 MG/150ML-% IV SOLN
750.0000 mg | INTRAVENOUS | Status: AC
  Administered 2012-12-06: 750 mg via INTRAVENOUS
  Filled 2012-12-06: qty 150

## 2012-12-06 MED ORDER — VANCOMYCIN HCL IN DEXTROSE 750-5 MG/150ML-% IV SOLN
750.0000 mg | Freq: Two times a day (BID) | INTRAVENOUS | Status: DC
  Administered 2012-12-07 (×2): 750 mg via INTRAVENOUS
  Filled 2012-12-06 (×3): qty 150

## 2012-12-06 NOTE — Progress Notes (Signed)
Patient ID: Lisa Cabrera, female   DOB: 1946/01/17, 67 y.o.   MRN: [id number]         [facility]    Date of Admission:  12/04/2012           Day 3 vancomycin Principal Problem:   Streptococcal bacteremia Active Problems:   Normocytic anemia   Hyperglycemia   Myxomatous mitral valve   . aspirin  81 mg Oral Daily  . doxepin  50 mg Oral QHS  . heparin  5,000 Units Subcutaneous Q8H  . vancomycin  750 mg Intravenous NOW  . [START ON 12/07/2012] vancomycin  750 mg Intravenous Q12H  . verapamil  240 mg Oral Daily    Subjective: She is feeling a little bit better today. She has not had any recorded fever since admission. She had some mild chills last night but no more sweats.  Objective: Temp:  [98.7 F (37.1 C)-99.5 F (37.5 C)] 98.7 F (37.1 C) (09/07 0505) Pulse Rate:  [79-85] 85 (09/07 0505) Resp:  [16-18] 16 (09/07 0505) BP: (115-119)/(69-72) 118/72 mmHg (09/07 0505) SpO2:  [95 %-98 %] 98 % (09/07 0505)  General: He'll clot she feels better. She is in good spirits Skin: Slightly erythematous nontender nodule on her left palm is unchanged. The petechial/telangiectatic lesions on her right forearm, upper chest and back have improved Lungs: Clear Cor: Regular S1 and S2. I do not hear a murmur Abdomen: Soft and nontender   Lab Results Lab Results  Component Value Date   WBC 7.1 12/05/2012   HGB 10.4* 12/05/2012   HCT 30.9* 12/05/2012   MCV 88.3 12/05/2012   PLT 215 12/05/2012    Lab Results  Component Value Date   CREATININE 0.93 12/05/2012   BUN 14 12/05/2012   NA 137 12/05/2012   K 4.0 12/05/2012   CL 103 12/05/2012   CO2 25 12/05/2012    Lab Results  Component Value Date   ALT 16 12/05/2012   AST 25 12/05/2012   ALKPHOS 66 12/05/2012   BILITOT 0.3 12/05/2012      Microbiology: Recent Results (from the past 240 hour(s))  CULTURE, BLOOD (SINGLE)     Status: None   Collection Time    12/02/12  6:10 PM      Result Value Range Status   Preliminary  Report STREPTOCOCCUS SPECIES   Preliminary  CULTURE, BLOOD (SINGLE)     Status: None   Collection Time    12/02/12  6:24 PM      Result Value Range Status   Preliminary Report STREPTOCOCCUS SPECIES   Preliminary  CULTURE, BLOOD (ROUTINE X 2)     Status: None   Collection Time    12/04/12  8:58 AM      Result Value Range Status   Specimen Description BLOOD RIGHT ARM   Final   Special Requests BOTTLES DRAWN AEROBIC ONLY 10CC   Final   Culture  Setup Time     Final   Value: 12/04/2012 16:27     Performed at [facility]   Culture     Final   Value: GRAM POSITIVE COCCI IN CHAINS     Note: Gram Stain Report Called to,Read Back By and Verified With: ARIELLE MOHAMMED 12/05/12 @ 8:25PM BY RUSCOE A.     Performed at [facility]   Report Status PENDING   Incomplete  CULTURE, BLOOD (ROUTINE X 2)     Status: None   Collection Time  12/04/12  9:05 AM      Result Value Range Status   Specimen Description BLOOD HAND RIGHT   Final   Special Requests BOTTLES DRAWN AEROBIC ONLY 5CC   Final   Culture  Setup Time     Final   Value: 12/04/2012 16:27     Performed at [facility]   Culture     Final   Value: GRAM POSITIVE COCCI IN CHAINS     Note: Gram Stain Report Called to,Read Back By and Verified With: RIELLE MOHAMMED 12/05/12 @ 8:25PM BY RUSCOE A.     Performed at [facility]   Report Status PENDING   Incomplete  URINE CULTURE     Status: None   Collection Time    12/04/12  9:54 AM      Result Value Range Status   Specimen Description URINE, CLEAN CATCH   Final   Special Requests NONE   Final   Culture  Setup Time     Final   Value: 12/04/2012 10:22     Performed at [facility] Count     Final   Value: NO GROWTH     Performed at [facility]   Culture     Final   Value: NO GROWTH     Performed at [facility]   Report Status 12/05/2012 FINAL   Final   Assessment: All 4 sets of blood cultures are now growing strep.  Initial eyes lids are still being typed but appear to be alpha hemolytic. 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 I will repeat blood cultures again tomorrow morning and continue vancomycin. She is scheduled for TEE.  Plan: 1. Continue vancomycin 2. Repeat blood cultures in a.m. Hold off on placing PICC until blood cultures are negative 3. TEE  Cliffton Asters, MD [facility] [phone] pager   [phone] cell 12/06/2012, 2:10 PM

## 2012-12-06 NOTE — Progress Notes (Signed)
ANTIBIOTIC CONSULT NOTE - FOLLOW UP  Pharmacy Consult for vancomycin Indication: suspected endocarditis  No Known Allergies  Patient Measurements: Height: 5\' 1"  (154.9 cm) Weight: 113 lb 1.5 oz (51.3 kg) IBW/kg (Calculated) : 47.8   Vital Signs: Temp: 98.7 F (37.1 C) (09/07 0505) Temp src: Oral (09/07 0505) BP: 118/72 mmHg (09/07 0505) Pulse Rate: 85 (09/07 0505) Intake/Output from previous day: 09/06 0701 - 09/07 0700 In: 443.5 [P.O.:120; I.V.:63.5; IV Piggyback:100] Out: -  Intake/Output from this shift:    Labs:  Recent Labs  12/04/12 0905 12/04/12 1300 12/05/12 0509  WBC 10.0 8.4 7.1  HGB 11.2* 11.2* 10.4*  PLT 223 215 215  CREATININE 0.89 0.87 0.93   Estimated Creatinine Clearance: 44.9 ml/min (by C-G formula based on Cr of 0.93). No results found for this basename: VANCOTROUGH, Leodis Binet, VANCORANDOM, GENTTROUGH, GENTPEAK, GENTRANDOM, TOBRATROUGH, TOBRAPEAK, TOBRARND, AMIKACINPEAK, AMIKACINTROU, AMIKACIN,  in the last 72 hours   Microbiology: Recent Results (from the past 720 hour(s))  CULTURE, BLOOD (SINGLE)     Status: None   Collection Time    12/02/12  6:10 PM      Result Value Range Status   Preliminary Report STREPTOCOCCUS SPECIES   Preliminary  CULTURE, BLOOD (SINGLE)     Status: None   Collection Time    12/02/12  6:24 PM      Result Value Range Status   Preliminary Report STREPTOCOCCUS SPECIES   Preliminary  CULTURE, BLOOD (ROUTINE X 2)     Status: None   Collection Time    12/04/12  8:58 AM      Result Value Range Status   Specimen Description BLOOD RIGHT ARM   Final   Special Requests BOTTLES DRAWN AEROBIC ONLY 10CC   Final   Culture  Setup Time     Final   Value: 12/04/2012 16:27     Performed at [facility]   Culture     Final   Value: GRAM POSITIVE COCCI IN CHAINS     Note: Gram Stain Report Called to,Read Back By and Verified With: ARIELLE MOHAMMED 12/05/12 @ 8:25PM BY RUSCOE A.     Performed at [facility]    Report Status PENDING   Incomplete  CULTURE, BLOOD (ROUTINE X 2)     Status: None   Collection Time    12/04/12  9:05 AM      Result Value Range Status   Specimen Description BLOOD HAND RIGHT   Final   Special Requests BOTTLES DRAWN AEROBIC ONLY 5CC   Final   Culture  Setup Time     Final   Value: 12/04/2012 16:27     Performed at [facility]   Culture     Final   Value: GRAM POSITIVE COCCI IN CHAINS     Note: Gram Stain Report Called to,Read Back By and Verified With: RIELLE MOHAMMED 12/05/12 @ 8:25PM BY RUSCOE A.     Performed at [facility]   Report Status PENDING   Incomplete  URINE CULTURE     Status: None   Collection Time    12/04/12  9:54 AM      Result Value Range Status   Specimen Description URINE, CLEAN CATCH   Final   Special Requests NONE   Final   Culture  Setup Time     Final   Value: 12/04/2012 10:22     Performed at [facility] Count     Final  Value: NO GROWTH     Performed at [facility]   Culture     Final   Value: NO GROWTH     Performed at [facility]   Report Status 12/05/2012 FINAL   Final    Anti-infectives   Start     Dose/Rate Route Frequency Ordered Stop   12/04/12 2200  vancomycin (VANCOCIN) 500 mg in sodium chloride 0.9 % 100 mL IVPB     500 mg 100 mL/hr over 60 Minutes Intravenous Every 12 hours 12/04/12 1132     12/04/12 0915  vancomycin (VANCOCIN) IVPB 1000 mg/200 mL premix     1,000 mg 200 mL/hr over 60 Minutes Intravenous  Once 12/04/12 0915 12/04/12 1131      Assessment: Patient is a 67 y.o F on vancomycin day #3 for suspected "subacute" streptococcal endocarditis with plan for TEE next week to confirm.  ID recommends to continue with vancomycin for now pending culture results. Steady state vancomycin level drawn around noon today now back below goal at 9.8 (drawn 1 hour late).  Goal of Therapy:  Vancomycin trough level 15-20 mcg/ml  Plan:  1) change vanc dose to 750mg   q12h  Alvah Gilder P 12/06/2012,9:52 AM

## 2012-12-06 NOTE — Progress Notes (Addendum)
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 Blood cultures from 9/5 also positive.  Sensitivities pending. Continue vancomycin. Discussed with Dr. Orvan Falconer 9/6. 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 Orvan Falconer, no further w/u or treatment at this time.  Code Status: full Family Communication: husband at bedside Disposition Plan: home   Consultants:  ID  Procedures:    Antibiotics:  vanc 9/5  HPI/Subjective: Still with night sweats. Otherwise feels fairly well.  Objective: Filed Vitals:   12/06/12 0505  BP: 118/72  Pulse: 85  Temp: 98.7 F (37.1 C)  Resp: 16    Intake/Output Summary (Last 24 hours) at 12/06/12 1139 Last data filed at 12/06/12 0622  Gross per 24 hour  Intake  223.5 ml  Output      0 ml  Net  223.5 ml   Filed Weights   12/04/12 0836 12/04/12 1300  Weight: 51.71 kg (114 lb) 51.3 kg (113 lb 1.5 oz)    Exam:   General:  Alert, oriented. Well-nourished. Comfortable. Nontoxic.  Cardiovascular: Regular rate rhythm. I could not appreciate a murmur.  Respiratory: Clear to auscultation bilaterally without wheeze rhonchi or rale  Abdomen: Soft nontender nondistended  Extremities: No edema.   Data Reviewed: Basic Metabolic Panel:  Recent Labs Lab 12/02/12 1808 12/04/12 0905 12/04/12 1300 12/05/12 0509  NA 135 134*  --  137  K 3.9 3.7  --  4.0  CL 100 99  --  103  CO2 25 23  --  25  GLUCOSE 100* 153*  --  99  BUN 13 12  --  14  CREATININE 0.84 0.89 0.87 0.93  CALCIUM 9.0 9.3  --  8.7   Liver Function Tests:  Recent Labs Lab 12/02/12 1808 12/04/12 0905 12/05/12 0509  AST 25 24 25   ALT 19 17 16   ALKPHOS 69 69 66  BILITOT 0.6 0.5 0.3   PROT 7.1 7.2 6.6  ALBUMIN 3.5 2.8* 2.5*   No results found for this basename: LIPASE, AMYLASE,  in the last 168 hours No results found for this basename: AMMONIA,  in the last 168 hours CBC:  Recent Labs Lab 12/02/12 1808 12/04/12 0905 12/04/12 1300 12/05/12 0509  WBC 9.5 10.0 8.4 7.1  NEUTROABS 7.4  --   --   --   HGB 11.5* 11.2* 11.2* 10.4*  HCT 34.0* 33.1* 33.2* 30.9*  MCV 85.9 87.6 87.6 88.3  PLT 246 223 215 215   Cardiac Enzymes: No results found for this basename: CKTOTAL, CKMB, CKMBINDEX, TROPONINI,  in the last 168 hours BNP (last 3 results) No results found for this basename: PROBNP,  in the last 8760 hours CBG: No results found for this basename: GLUCAP,  in the last 168 hours  Recent Results (from the past 240 hour(s))  CULTURE, BLOOD (SINGLE)     Status: None   Collection Time    12/02/12  6:10 PM      Result Value Range Status   Preliminary Report STREPTOCOCCUS SPECIES   Preliminary  CULTURE, BLOOD (SINGLE)     Status: None   Collection Time    12/02/12  6:24 PM      Result Value Range Status   Preliminary Report STREPTOCOCCUS SPECIES   Preliminary  CULTURE, BLOOD (ROUTINE  X 2)     Status: None   Collection Time    12/04/12  8:58 AM      Result Value Range Status   Specimen Description BLOOD RIGHT ARM   Final   Special Requests BOTTLES DRAWN AEROBIC ONLY 10CC   Final   Culture  Setup Time     Final   Value: 12/04/2012 16:27     Performed at [facility]   Culture     Final   Value: GRAM POSITIVE COCCI IN CHAINS     Note: Gram Stain Report Called to,Read Back By and Verified With: ARIELLE MOHAMMED 12/05/12 @ 8:25PM BY RUSCOE A.     Performed at [facility]   Report Status PENDING   Incomplete  CULTURE, BLOOD (ROUTINE X 2)     Status: None   Collection Time    12/04/12  9:05 AM      Result Value Range Status   Specimen Description BLOOD HAND RIGHT   Final   Special Requests BOTTLES DRAWN AEROBIC ONLY 5CC   Final   Culture  Setup  Time     Final   Value: 12/04/2012 16:27     Performed at [facility]   Culture     Final   Value: GRAM POSITIVE COCCI IN CHAINS     Note: Gram Stain Report Called to,Read Back By and Verified With: RIELLE MOHAMMED 12/05/12 @ 8:25PM BY RUSCOE A.     Performed at [facility]   Report Status PENDING   Incomplete  URINE CULTURE     Status: None   Collection Time    12/04/12  9:54 AM      Result Value Range Status   Specimen Description URINE, CLEAN CATCH   Final   Special Requests NONE   Final   Culture  Setup Time     Final   Value: 12/04/2012 10:22     Performed at [facility] Count     Final   Value: NO GROWTH     Performed at [facility]   Culture     Final   Value: NO GROWTH     Performed at [facility]   Report Status 12/05/2012 FINAL   Final     Studies: No results found.  Scheduled Meds: . aspirin  81 mg Oral Daily  . doxepin  50 mg Oral QHS  . heparin  5,000 Units Subcutaneous Q8H  . vancomycin  500 mg Intravenous Q12H  . verapamil  240 mg Oral Daily   Continuous Infusions: . sodium chloride 10 mL/hr at 12/04/12 1257   Echo Left ventricle: The cavity size was normal. Wall thickness was normal. Systolic function was normal. The estimated ejection fraction was in the range of 55% to 60%. Wall motion was normal; there were no regional wall motion abnormalities. Doppler parameters are consistent with abnormal left ventricular relaxation (grade 1 diastolic dysfunction). - Mitral valve: Moderate thickening, consistent with myxomatous proliferation. Moderate prolapse, involving the anterior leaflet. Mild regurgitation. - Left atrium: The atrium was mildly dilated. - Pericardium, extracardiac: A trivial pericardial effusion was identified. Impressions:  - Mitral valve is myxomatous with MVP; no obvious vegetation; suggest TEE to further evaluate if clincially indicated.  Time spent: 25  minutes  Jaykob Minichiello L  [facility] Pager [phone]. If 7PM-7AM, please contact night-coverage at [email or web address], password [facility] 12/06/2012, 11:39 AM  LOS: 2 days

## 2012-12-07 ENCOUNTER — Encounter (HOSPITAL_COMMUNITY)
Admission: EM | Disposition: A | Discharge status: Home Health | Payer: Medicare Other | Source: Home / Self Care | Attending: Internal Medicine

## 2012-12-07 ENCOUNTER — Encounter (HOSPITAL_COMMUNITY): Payer: Self-pay | Admitting: Gastroenterology

## 2012-12-07 DIAGNOSIS — I059 Rheumatic mitral valve disease, unspecified: Secondary | ICD-10-CM | POA: Diagnosis not present

## 2012-12-07 DIAGNOSIS — R7881 Bacteremia: Secondary | ICD-10-CM | POA: Diagnosis not present

## 2012-12-07 DIAGNOSIS — D649 Anemia, unspecified: Secondary | ICD-10-CM | POA: Diagnosis not present

## 2012-12-07 DIAGNOSIS — I669 Occlusion and stenosis of unspecified cerebral artery: Secondary | ICD-10-CM

## 2012-12-07 DIAGNOSIS — A491 Streptococcal infection, unspecified site: Secondary | ICD-10-CM | POA: Diagnosis not present

## 2012-12-07 HISTORY — PX: TEE WITHOUT CARDIOVERSION: SHX5443

## 2012-12-07 LAB — CULTURE, BLOOD (ROUTINE X 2)

## 2012-12-07 LAB — CULTURE, BLOOD (SINGLE)

## 2012-12-07 SURGERY — ECHOCARDIOGRAM, TRANSESOPHAGEAL
Anesthesia: Moderate Sedation

## 2012-12-07 MED ORDER — BUTAMBEN-TETRACAINE-BENZOCAINE 2-2-14 % EX AERO
INHALATION_SPRAY | CUTANEOUS | Status: DC | PRN
  Administered 2012-12-07: 2 via TOPICAL

## 2012-12-07 MED ORDER — SODIUM CHLORIDE 0.9 % IV SOLN: INTRAVENOUS | Status: DC

## 2012-12-07 MED ORDER — FENTANYL CITRATE 0.05 MG/ML IJ SOLN
INTRAMUSCULAR | Status: AC
  Filled 2012-12-07: qty 2

## 2012-12-07 MED ORDER — MIDAZOLAM HCL 5 MG/ML IJ SOLN
INTRAMUSCULAR | Status: AC
  Filled 2012-12-07: qty 2

## 2012-12-07 MED ORDER — FENTANYL CITRATE 0.05 MG/ML IJ SOLN
INTRAMUSCULAR | Status: DC | PRN
  Administered 2012-12-07 (×2): 25 ug via INTRAVENOUS

## 2012-12-07 MED ORDER — MIDAZOLAM HCL 10 MG/2ML IJ SOLN
INTRAMUSCULAR | Status: DC | PRN
  Administered 2012-12-07: 1 mg via INTRAVENOUS
  Administered 2012-12-07 (×2): 2 mg via INTRAVENOUS

## 2012-12-07 MED ORDER — DEXTROSE 5 % IV SOLN
2.0000 g | INTRAVENOUS | Status: DC
  Administered 2012-12-07 – 2012-12-08 (×2): 2 g via INTRAVENOUS
  Filled 2012-12-07 (×3): qty 2

## 2012-12-07 NOTE — Progress Notes (Signed)
INFECTIOUS DISEASE PROGRESS NOTE  ID: Lisa Cabrera is a 67 y.o. female with  Principal Problem:   Streptococcal bacteremia Active Problems:   Normocytic anemia   Hyperglycemia   Myxomatous mitral valve  Subjective: Without complaints, no f/c.   Abtx:  Anti-infectives   Start     Dose/Rate Route Frequency Ordered Stop   12/07/12 0100  vancomycin (VANCOCIN) IVPB 750 mg/150 ml premix     750 mg 150 mL/hr over 60 Minutes Intravenous Every 12 hours 12/06/12 1306     12/06/12 1315  vancomycin (VANCOCIN) IVPB 750 mg/150 ml premix     750 mg 150 mL/hr over 60 Minutes Intravenous NOW 12/06/12 1306 12/06/12 1533   12/04/12 2200  vancomycin (VANCOCIN) 500 mg in sodium chloride 0.9 % 100 mL IVPB  Status:  Discontinued     500 mg 100 mL/hr over 60 Minutes Intravenous Every 12 hours 12/04/12 1132 12/06/12 1307   12/04/12 0915  vancomycin (VANCOCIN) IVPB 1000 mg/200 mL premix     1,000 mg 200 mL/hr over 60 Minutes Intravenous  Once 12/04/12 0915 12/04/12 1131      Medications:  Scheduled: . aspirin  81 mg Oral Daily  . doxepin  50 mg Oral QHS  . heparin  5,000 Units Subcutaneous Q8H  . vancomycin  750 mg Intravenous Q12H  . verapamil  240 mg Oral Daily    Objective: Vital signs in last 24 hours: Temp:  [97.6 F (36.4 C)-100.4 F (38 C)] 98.2 F (36.8 C) (09/08 1347) Pulse Rate:  [84-109] 109 (09/08 1347) Resp:  [14-18] 14 (09/08 1410) BP: (116-152)/(65-90) 152/90 mmHg (09/08 1410) SpO2:  [96 %-100 %] 100 % (09/08 1410)   General appearance: alert, cooperative and no distress Resp: clear to auscultation bilaterally Cardio: regular rate and rhythm GI: normal findings: bowel sounds normal and soft, non-tender  Lab Results  Recent Labs  12/05/12 0509  WBC 7.1  HGB 10.4*  HCT 30.9*  NA 137  K 4.0  CL 103  CO2 25  BUN 14  CREATININE 0.93   Liver Panel  Recent Labs  12/05/12 0509  PROT 6.6  ALBUMIN 2.5*  AST 25  ALT 16  ALKPHOS 66  BILITOT 0.3    Sedimentation Rate No results found for this basename: ESRSEDRATE,  in the last 72 hours C-Reactive Protein No results found for this basename: CRP,  in the last 72 hours  Microbiology: Recent Results (from the past 240 hour(s))  CULTURE, BLOOD (SINGLE)     Status: None   Collection Time    12/02/12  6:10 PM      Result Value Range Status   Organism ID, Bacteria STREPTOCOCCUS SPECIES   Final   Comment: Susceptibilities performed on previous culture     within the last 5 days.     IDENTIFIED AS STREPTOCOCCUS MUTANS     Gram Stain Report Called to,Read Back By     and Verified With:     NEEMA SHARDA ON 12/03/2012 AT 9:21P BY WILEJ  CULTURE, BLOOD (SINGLE)     Status: None   Collection Time    12/02/12  6:24 PM      Result Value Range Status   Culture STREPTOCOCCUS SPECIES   Final   Organism ID, Bacteria STREPTOCOCCUS SPECIES   Final   Comment: IDENTIFIED AS STREPTOCOCCUS MUTANS     Gram Stain Report Called to,Read Back By     and Verified With:     NEEMA SHARDA ON 12/03/2012  AT 9:21P BY WILEJ  CULTURE, BLOOD (ROUTINE X 2)     Status: None   Collection Time    12/04/12  8:58 AM      Result Value Range Status   Specimen Description BLOOD RIGHT ARM   Final   Special Requests BOTTLES DRAWN AEROBIC ONLY 10CC   Final   Culture  Setup Time     Final   Value: 12/04/2012 16:27     Performed at [facility]   Culture     Final   Value: STREPTOCOCCUS SPECIES     Note: SUSCEPTIBILITIES PERFORMED ON PREVIOUS CULTURE WITHIN THE LAST 5 DAYS. IDENTIFIED AS STREP MUTANS     Note: Gram Stain Report Called to,Read Back By and Verified With: ARIELLE MOHAMMED 12/05/12 @ 8:25PM BY RUSCOE A.     Performed at [facility]   Report Status 12/07/2012 FINAL   Final  CULTURE, BLOOD (ROUTINE X 2)     Status: None   Collection Time    12/04/12  9:05 AM      Result Value Range Status   Specimen Description BLOOD HAND RIGHT   Final   Special Requests BOTTLES DRAWN AEROBIC ONLY 5CC   Final    Culture  Setup Time     Final   Value: 12/04/2012 16:27     Performed at [facility]   Culture     Final   Value: STREPTOCOCCUS SPECIES     Note: IDENTIFIED AS STREPTOCOCCUS MUTANS SUSCEPTIBILITIES PERFORMED ON PREVIOUS CULTURE WITHIN THE LAST 5 DAYS.     Note: Gram Stain Report Called to,Read Back By and Verified With: RIELLE MOHAMMED 12/05/12 @ 8:25PM BY RUSCOE A.     Performed at [facility]   Report Status 12/07/2012 FINAL   Final  URINE CULTURE     Status: None   Collection Time    12/04/12  9:54 AM      Result Value Range Status   Specimen Description URINE, CLEAN CATCH   Final   Special Requests NONE   Final   Culture  Setup Time     Final   Value: 12/04/2012 10:22     Performed at [facility] Count     Final   Value: NO GROWTH     Performed at [facility]   Culture     Final   Value: NO GROWTH     Performed at [facility]   Report Status 12/05/2012 FINAL   Final  CULTURE, BLOOD (ROUTINE X 2)     Status: None   Collection Time    12/04/12 11:15 PM      Result Value Range Status   Specimen Description BLOOD RIGHT ARM   Final   Special Requests BOTTLES DRAWN AEROBIC ONLY 5CC   Final   Culture  Setup Time     Final   Value: 12/05/2012 18:25     Performed at [facility]   Culture     Final   Value:        BLOOD CULTURE RECEIVED NO GROWTH TO DATE CULTURE WILL BE HELD FOR 5 DAYS BEFORE ISSUING A FINAL NEGATIVE REPORT     Performed at [facility]   Report Status PENDING   Incomplete  CULTURE, BLOOD (ROUTINE X 2)     Status: None   Collection Time    12/04/12 11:30 PM      Result Value Range Status  Specimen Description BLOOD RIGHT HAND   Final   Special Requests BOTTLES DRAWN AEROBIC ONLY 5CC   Final   Culture  Setup Time     Final   Value: 12/05/2012 18:24     Performed at [facility]   Culture     Final   Value:        BLOOD CULTURE RECEIVED NO GROWTH TO DATE CULTURE WILL BE HELD FOR 5  DAYS BEFORE ISSUING A FINAL NEGATIVE REPORT     Performed at [facility]   Report Status PENDING   Incomplete    Studies/Results: No results found.   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 Change anbx to Ceftriaxone Would plan to treat her 1 month, as if she had endocarditis.  Plan to place [facility] when repeat BCx are (-) Will defer LTBI treatment to f/u in ID clinic (probably after IE therapy).    Total days of antibiotics: Vancomycin Day 4         Johny Sax Infectious Diseases (pager) [phone] [email or web address] 12/07/2012, 2:15 PM  LOS: 3 days

## 2012-12-07 NOTE — Progress Notes (Addendum)
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 Blood cultures from 9/5 also positive.  Sensitivities pending. Continue vancomycin. Discussed with Dr. Orvan Falconer 9/6. TEE today. PICC once [facility] are clear Active Problems:   Myxomatous mitral valve with prolapse:    Normocytic anemia positive interferon gamma release assay: per Dr. Orvan Falconer, no further w/u or treatment at this time.  Code Status: full Family Communication: husband at bedside Disposition Plan: home   Consultants:  ID  Procedures:    Antibiotics:  vanc 9/5  HPI/Subjective: Still with night sweats. Otherwise feels fairly well.  Objective: Filed Vitals:   12/07/12 0757  BP: 118/76  Pulse: 88  Temp: 97.6 F (36.4 C)  Resp:     Intake/Output Summary (Last 24 hours) at 12/07/12 1146 Last data filed at 12/07/12 0610  Gross per 24 hour  Intake 261.67 ml  Output      0 ml  Net 261.67 ml   Filed Weights   12/04/12 0836 12/04/12 1300  Weight: 51.71 kg (114 lb) 51.3 kg (113 lb 1.5 oz)    Exam:   General:  Alert, oriented. Well-nourished. Comfortable. Nontoxic.  Cardiovascular: Regular rate rhythm. I could not appreciate a murmur.  Respiratory: Clear to auscultation bilaterally without wheeze rhonchi or rale  Abdomen: Soft nontender nondistended  Extremities: No edema.   Data Reviewed: Basic Metabolic Panel:  Recent Labs Lab 12/02/12 1808 12/04/12 0905 12/04/12 1300 12/05/12 0509  NA 135 134*  --  137  K 3.9 3.7  --  4.0  CL 100 99  --  103  CO2 25 23  --  25  GLUCOSE 100* 153*  --  99  BUN 13 12  --  14  CREATININE 0.84 0.89 0.87 0.93  CALCIUM 9.0 9.3  --  8.7   Liver Function Tests:  Recent Labs Lab 12/02/12 1808 12/04/12 0905 12/05/12 0509  AST 25 24 25   ALT 19 17 16   ALKPHOS 69 69 66  BILITOT  0.6 0.5 0.3  PROT 7.1 7.2 6.6  ALBUMIN 3.5 2.8* 2.5*   No results found for this basename: LIPASE, AMYLASE,  in the last 168 hours No results found for this basename: AMMONIA,  in the last 168 hours CBC:  Recent Labs Lab 12/02/12 1808 12/04/12 0905 12/04/12 1300 12/05/12 0509  WBC 9.5 10.0 8.4 7.1  NEUTROABS 7.4  --   --   --   HGB 11.5* 11.2* 11.2* 10.4*  HCT 34.0* 33.1* 33.2* 30.9*  MCV 85.9 87.6 87.6 88.3  PLT 246 223 215 215   Cardiac Enzymes: No results found for this basename: CKTOTAL, CKMB, CKMBINDEX, TROPONINI,  in the last 168 hours BNP (last 3 results) No results found for this basename: PROBNP,  in the last 8760 hours CBG: No results found for this basename: GLUCAP,  in the last 168 hours  Recent Results (from the past 240 hour(s))  CULTURE, BLOOD (SINGLE)     Status: None   Collection Time    12/02/12  6:10 PM      Result Value Range Status   Preliminary Report STREPTOCOCCUS SPECIES   Preliminary  CULTURE, BLOOD (SINGLE)     Status: None   Collection Time    12/02/12  6:24 PM      Result Value Range Status   Preliminary Report STREPTOCOCCUS SPECIES   Preliminary  CULTURE, BLOOD (ROUTINE  X 2)     Status: None   Collection Time    12/04/12  8:58 AM      Result Value Range Status   Specimen Description BLOOD RIGHT ARM   Final   Special Requests BOTTLES DRAWN AEROBIC ONLY 10CC   Final   Culture  Setup Time     Final   Value: 12/04/2012 16:27     Performed at [facility]   Culture     Final   Value: GRAM POSITIVE COCCI IN CHAINS     Note: Gram Stain Report Called to,Read Back By and Verified With: ARIELLE MOHAMMED 12/05/12 @ 8:25PM BY RUSCOE A.     Performed at [facility]   Report Status PENDING   Incomplete  CULTURE, BLOOD (ROUTINE X 2)     Status: None   Collection Time    12/04/12  9:05 AM      Result Value Range Status   Specimen Description BLOOD HAND RIGHT   Final   Special Requests BOTTLES DRAWN AEROBIC ONLY 5CC   Final    Culture  Setup Time     Final   Value: 12/04/2012 16:27     Performed at [facility]   Culture     Final   Value: GRAM POSITIVE COCCI IN CHAINS     Note: Gram Stain Report Called to,Read Back By and Verified With: RIELLE MOHAMMED 12/05/12 @ 8:25PM BY RUSCOE A.     Performed at [facility]   Report Status PENDING   Incomplete  URINE CULTURE     Status: None   Collection Time    12/04/12  9:54 AM      Result Value Range Status   Specimen Description URINE, CLEAN CATCH   Final   Special Requests NONE   Final   Culture  Setup Time     Final   Value: 12/04/2012 10:22     Performed at [facility] Count     Final   Value: NO GROWTH     Performed at [facility]   Culture     Final   Value: NO GROWTH     Performed at [facility]   Report Status 12/05/2012 FINAL   Final  CULTURE, BLOOD (ROUTINE X 2)     Status: None   Collection Time    12/04/12 11:15 PM      Result Value Range Status   Specimen Description BLOOD RIGHT ARM   Final   Special Requests BOTTLES DRAWN AEROBIC ONLY 5CC   Final   Culture  Setup Time     Final   Value: 12/05/2012 18:25     Performed at [facility]   Culture     Final   Value:        BLOOD CULTURE RECEIVED NO GROWTH TO DATE CULTURE WILL BE HELD FOR 5 DAYS BEFORE ISSUING A FINAL NEGATIVE REPORT     Performed at [facility]   Report Status PENDING   Incomplete  CULTURE, BLOOD (ROUTINE X 2)     Status: None   Collection Time    12/04/12 11:30 PM      Result Value Range Status   Specimen Description BLOOD RIGHT HAND   Final   Special Requests BOTTLES DRAWN AEROBIC ONLY 5CC   Final   Culture  Setup Time     Final   Value: 12/05/2012 18:24     Performed  at [facility]     Final   Value:        BLOOD CULTURE RECEIVED NO GROWTH TO DATE CULTURE WILL BE HELD FOR 5 DAYS BEFORE ISSUING A FINAL NEGATIVE REPORT     Performed at [facility]   Report Status PENDING    Incomplete     Studies: No results found.  Scheduled Meds: . aspirin  81 mg Oral Daily  . doxepin  50 mg Oral QHS  . heparin  5,000 Units Subcutaneous Q8H  . vancomycin  750 mg Intravenous Q12H  . verapamil  240 mg Oral Daily   Continuous Infusions: . sodium chloride 10 mL/hr at 12/04/12 1257  . sodium chloride     Echo Left ventricle: The cavity size was normal. Wall thickness was normal. Systolic function was normal. The estimated ejection fraction was in the range of 55% to 60%. Wall motion was normal; there were no regional wall motion abnormalities. Doppler parameters are consistent with abnormal left ventricular relaxation (grade 1 diastolic dysfunction). - Mitral valve: Moderate thickening, consistent with myxomatous proliferation. Moderate prolapse, involving the anterior leaflet. Mild regurgitation. - Left atrium: The atrium was mildly dilated. - Pericardium, extracardiac: A trivial pericardial effusion was identified. Impressions:  - Mitral valve is myxomatous with MVP; no obvious vegetation; suggest TEE to further evaluate if clincially indicated.  Time spent: 15 minutes  Martavius Lusty L  [facility] Pager [phone]. If 7PM-7AM, please contact night-coverage at [email or web address], password [facility] 12/07/2012, 11:46 AM  LOS: 3 days

## 2012-12-07 NOTE — Op Note (Signed)
INDICATIONS: infective endocarditis  PROCEDURE:   Informed consent was obtained prior to the procedure. The risks, benefits and alternatives for the procedure were discussed and the patient comprehended these risks.  Risks include, but are not limited to, cough, sore throat, vomiting, nausea, somnolence, esophageal and stomach trauma or perforation, bleeding, low blood pressure, aspiration, pneumonia, infection, trauma to the teeth and death.    After a procedural time-out, the oropharynx was anesthetized with 20% benzocaine spray. The patient was given 6 mg versed and 50 mcg fentanyl for moderate sedation.   The transesophageal probe was inserted in the esophagus and stomach without difficulty and multiple views were obtained.  The patient was kept under observation until the patient left the procedure room.  The patient left the procedure room in stable condition.   Agitated microbubble saline contrast was not administered.  COMPLICATIONS:    There were no immediate complications.  FINDINGS:  No vegetations are seen. Marked myxomatous degeneration of the mitral leaflets. Mitral valve prolapse, especially of the A3 segment of the anterior leaflet. Highly eccentric, mild to moderate mitral insufficiency. Tricuspid valve prolapse. Mild to moderate central tricuspid insufficiency Normal LV function. Normal LA size. No aortic abnormalities.  RECOMMENDATIONS:   No vegetations are seen, but there is significant degenerative change of the mitral and tricuspid valves. Probably warranted to treat as if endocarditis was confirmed.  Time Spent Directly with the Patient:  30 minutes   Vuk Skillern 12/07/2012, 2:42 PM

## 2012-12-07 NOTE — Progress Notes (Signed)
Pt scheduled for TEE today with Dr. Warnell Bureau at 1400 Hrs.  NPO.

## 2012-12-07 NOTE — Progress Notes (Signed)
  Echocardiogram Echocardiogram Transesophageal has been performed.  Jorje Guild 12/07/2012, 3:03 PM

## 2012-12-08 ENCOUNTER — Ambulatory Visit: Payer: Medicare Other | Admitting: Internal Medicine

## 2012-12-08 ENCOUNTER — Encounter (HOSPITAL_COMMUNITY): Payer: Self-pay | Admitting: Cardiovascular Disease

## 2012-12-08 DIAGNOSIS — A491 Streptococcal infection, unspecified site: Secondary | ICD-10-CM | POA: Diagnosis not present

## 2012-12-08 DIAGNOSIS — I059 Rheumatic mitral valve disease, unspecified: Secondary | ICD-10-CM | POA: Diagnosis not present

## 2012-12-08 DIAGNOSIS — R7881 Bacteremia: Secondary | ICD-10-CM | POA: Diagnosis not present

## 2012-12-08 MED ORDER — SODIUM CHLORIDE 0.9 % IJ SOLN
10.0000 mL | INTRAMUSCULAR | Status: DC | PRN
  Administered 2012-12-08: 10 mL

## 2012-12-08 MED ORDER — DEXTROSE 5 % IV SOLN: 2.0000 g | INTRAVENOUS | Status: DC

## 2012-12-08 NOTE — Progress Notes (Signed)
Peripherally Inserted Central Catheter/Midline Placement  The IV Nurse has discussed with the patient and/or persons authorized to consent for the patient, the purpose of this procedure and the potential benefits and risks involved with this procedure.  The benefits include less needle sticks, lab draws from the catheter and patient may be discharged home with the catheter.  Risks include, but not limited to, infection, bleeding, blood clot (thrombus formation), and puncture of an artery; nerve damage and irregular heat beat.  Alternatives to this procedure were also discussed.  PICC/Midline Placement Documentation  PICC / Midline Single Lumen 12/08/12 PICC Right Basilic (Active)  Dressing Change Due 12/15/12 12/08/2012  4:13 PM       Stacie Glaze Horton 12/08/2012, 4:30 PM

## 2012-12-08 NOTE — Progress Notes (Signed)
INFECTIOUS DISEASE PROGRESS NOTE  Lisa Cabrera is a 67 y.o. female with  Principal Problem:   Streptococcal bacteremia Active Problems:   Normocytic anemia   Hyperglycemia   Myxomatous mitral valve  Subjective: Without complaints.   Abtx:  Anti-infectives   Start     Dose/Rate Route Frequency Ordered Stop   12/07/12 1800  cefTRIAXone (ROCEPHIN) 2 g in dextrose 5 % 50 mL IVPB     2 g 100 mL/hr over 30 Minutes Intravenous Every 24 hours 12/07/12 1739 12/31/12 1759   12/07/12 0100  vancomycin (VANCOCIN) IVPB 750 mg/150 ml premix  Status:  Discontinued     750 mg 150 mL/hr over 60 Minutes Intravenous Every 12 hours 12/06/12 1306 12/07/12 1739   12/06/12 1315  vancomycin (VANCOCIN) IVPB 750 mg/150 ml premix     750 mg 150 mL/hr over 60 Minutes Intravenous NOW 12/06/12 1306 12/06/12 1533   12/04/12 2200  vancomycin (VANCOCIN) 500 mg in sodium chloride 0.9 % 100 mL IVPB  Status:  Discontinued     500 mg 100 mL/hr over 60 Minutes Intravenous Every 12 hours 12/04/12 1132 12/06/12 1307   12/04/12 0915  vancomycin (VANCOCIN) IVPB 1000 mg/200 mL premix     1,000 mg 200 mL/hr over 60 Minutes Intravenous  Once 12/04/12 0915 12/04/12 1131      Medications:  Scheduled: . aspirin  81 mg Oral Daily  . cefTRIAXone (ROCEPHIN)  IV  2 g Intravenous Q24H  . doxepin  50 mg Oral QHS  . heparin  5,000 Units Subcutaneous Q8H  . verapamil  240 mg Oral Daily    Objective: Vital signs in last 24 hours: Temp:  [98 F (36.7 C)-98.6 F (37 C)] 98.5 F (36.9 C) (09/09 0517) Pulse Rate:  [80-109] 85 (09/09 0517) Resp:  [14-28] 18 (09/09 0517) BP: (101-169)/(50-90) 104/50 mmHg (09/09 0935) SpO2:  [96 %-100 %] 96 % (09/09 0517)   General appearance: alert, cooperative and no distress Resp: clear to auscultation bilaterally Cardio: regular rate and rhythm GI: normal findings: bowel sounds normal and soft, non-tender  Lab Results No results found for this basename: WBC, HGB, HCT,  PLATELETS, NA, K, CL, CO2, BUN, CREATININE, GLU,  in the last 72 hours Liver Panel No results found for this basename: PROT, ALBUMIN, AST, ALT, ALKPHOS, BILITOT, BILIDIR, IBILI,  in the last 72 hours Sedimentation Rate No results found for this basename: ESRSEDRATE,  in the last 72 hours C-Reactive Protein No results found for this basename: CRP,  in the last 72 hours  Microbiology: Recent Results (from the past 240 hour(s))  CULTURE, BLOOD (SINGLE)     Status: None   Collection Time    12/02/12  6:10 PM      Result Value Range Status   Organism ID, Bacteria STREPTOCOCCUS SPECIES   Final   Comment: Susceptibilities performed on previous culture     within the last 5 days.     IDENTIFIED AS STREPTOCOCCUS MUTANS     Gram Stain Report Called to,Read Back By     and Verified With:     NEEMA SHARDA ON 12/03/2012 AT 9:21P BY WILEJ  CULTURE, BLOOD (SINGLE)     Status: None   Collection Time    12/02/12  6:24 PM      Result Value Range Status   Culture STREPTOCOCCUS SPECIES   Final   Organism ID, Bacteria STREPTOCOCCUS SPECIES   Final   Comment: IDENTIFIED AS STREPTOCOCCUS MUTANS  Gram Stain Report Called to,Read Back By     and Verified With:     NEEMA SHARDA ON 12/03/2012 AT 9:21P BY WILEJ  CULTURE, BLOOD (ROUTINE X 2)     Status: None   Collection Time    12/04/12  8:58 AM      Result Value Range Status   Specimen Description BLOOD RIGHT ARM   Final   Special Requests BOTTLES DRAWN AEROBIC ONLY 10CC   Final   Culture  Setup Time     Final   Value: 12/04/2012 16:27     Performed at [facility]   Culture     Final   Value: STREPTOCOCCUS SPECIES     Note: SUSCEPTIBILITIES PERFORMED ON PREVIOUS CULTURE WITHIN THE LAST 5 DAYS. IDENTIFIED AS STREP MUTANS     Note: Gram Stain Report Called to,Read Back By and Verified With: ARIELLE MOHAMMED 12/05/12 @ 8:25PM BY RUSCOE A.     Performed at [facility]   Report Status 12/07/2012 FINAL   Final  CULTURE, BLOOD (ROUTINE X  2)     Status: None   Collection Time    12/04/12  9:05 AM      Result Value Range Status   Specimen Description BLOOD HAND RIGHT   Final   Special Requests BOTTLES DRAWN AEROBIC ONLY 5CC   Final   Culture  Setup Time     Final   Value: 12/04/2012 16:27     Performed at [facility]   Culture     Final   Value: STREPTOCOCCUS SPECIES     Note: IDENTIFIED AS STREPTOCOCCUS MUTANS SUSCEPTIBILITIES PERFORMED ON PREVIOUS CULTURE WITHIN THE LAST 5 DAYS.     Note: Gram Stain Report Called to,Read Back By and Verified With: RIELLE MOHAMMED 12/05/12 @ 8:25PM BY RUSCOE A.     Performed at [facility]   Report Status 12/07/2012 FINAL   Final  URINE CULTURE     Status: None   Collection Time    12/04/12  9:54 AM      Result Value Range Status   Specimen Description URINE, CLEAN CATCH   Final   Special Requests NONE   Final   Culture  Setup Time     Final   Value: 12/04/2012 10:22     Performed at [facility] Count     Final   Value: NO GROWTH     Performed at [facility]   Culture     Final   Value: NO GROWTH     Performed at [facility]   Report Status 12/05/2012 FINAL   Final  CULTURE, BLOOD (ROUTINE X 2)     Status: None   Collection Time    12/04/12 11:15 PM      Result Value Range Status   Specimen Description BLOOD RIGHT ARM   Final   Special Requests BOTTLES DRAWN AEROBIC ONLY 5CC   Final   Culture  Setup Time     Final   Value: 12/05/2012 18:25     Performed at [facility]   Culture     Final   Value:        BLOOD CULTURE RECEIVED NO GROWTH TO DATE CULTURE WILL BE HELD FOR 5 DAYS BEFORE ISSUING A FINAL NEGATIVE REPORT     Performed at [facility]   Report Status PENDING   Incomplete  CULTURE, BLOOD (ROUTINE X 2)  Status: None   Collection Time    12/04/12 11:30 PM      Result Value Range Status   Specimen Description BLOOD RIGHT HAND   Final   Special Requests BOTTLES DRAWN AEROBIC ONLY 5CC   Final    Culture  Setup Time     Final   Value: 12/05/2012 18:24     Performed at [facility]   Culture     Final   Value:        BLOOD CULTURE RECEIVED NO GROWTH TO DATE CULTURE WILL BE HELD FOR 5 DAYS BEFORE ISSUING A FINAL NEGATIVE REPORT     Performed at [facility]   Report Status PENDING   Incomplete  CULTURE, BLOOD (ROUTINE X 2)     Status: None   Collection Time    12/07/12  9:35 AM      Result Value Range Status   Specimen Description BLOOD LEFT ANTECUBITAL   Final   Special Requests BOTTLES DRAWN AEROBIC AND ANAEROBIC 10CC   Final   Culture  Setup Time     Final   Value: 12/07/2012 16:44     Performed at [facility]   Culture     Final   Value:        BLOOD CULTURE RECEIVED NO GROWTH TO DATE CULTURE WILL BE HELD FOR 5 DAYS BEFORE ISSUING A FINAL NEGATIVE REPORT     Performed at [facility]   Report Status PENDING   Incomplete  CULTURE, BLOOD (ROUTINE X 2)     Status: None   Collection Time    12/07/12  9:40 AM      Result Value Range Status   Specimen Description BLOOD LEFT HAND   Final   Special Requests BOTTLES DRAWN AEROBIC AND ANAEROBIC 10CC   Final   Culture  Setup Time     Final   Value: 12/07/2012 16:44     Performed at [facility]   Culture     Final   Value:        BLOOD CULTURE RECEIVED NO GROWTH TO DATE CULTURE WILL BE HELD FOR 5 DAYS BEFORE ISSUING A FINAL NEGATIVE REPORT     Performed at [facility]   Report Status PENDING   Incomplete    Studies/Results: No results found.   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  Etiology of her bacteremia is ? She did have dental work in the spring.  Will place [facility].  Plan on her f/u with Dr Drue Second in ID clinic in 2 weeks.  Available if questions.          Johny Sax Infectious Diseases (pager) [phone] [email or web address] 12/08/2012, 11:17 AM  LOS: 4  days

## 2012-12-08 NOTE — Progress Notes (Signed)
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  Per ID- proceed with PICC line placement (Dr. Ninetta Lights).  Likely home tomorrow with at least 4 weeks of IV antibiotics and outpatient infectious disease followup. Patient questioning whether she will need lifelong antibiotic prophylaxis prior to procedures. Will defer to ID.  Active Problems: Normocytic anemia Myxomatous Mitral Valve with prolapse  Positive interferon gamma release assay: Will be addressed after treatment of her endocarditis, per Dr. Orvan Falconer  Code Status: Full Family Communication: Husband at bedside Disposition Plan: To Home with husband with home health assistance for continued IV Rocephin and PICC line maintenance.   Consultants:  ID  Procedures:  TEE performed on 12/07/12  Antibiotics:  Rocephin 2g/24hr started 12/07/12 to be continued x 1 month  HPI/Subjective: 67 yo WF with streptococci mutans bacteremia and possible infective endocarditis.  Doing very well today.  Slept for 8 hrs last night uninterrupted.  Reports new small, flat, erythematous, non-painful, non-pruritic lesions on lower extremities bilaterally.  Reports night sweats occurring around 7-8pm nightly, and associates with possible influx in temperature. Denies fever, chills, chest pain, SOB, nausea, and vomiting.  Objective: Filed Vitals:   12/08/12 0935  BP: 104/50  Pulse:   Temp:   Resp:     Intake/Output Summary (Last 24 hours) at 12/08/12 1303 Last data filed at 12/08/12 0900  Gross per 24 hour  Intake   1535 ml  Output      0 ml  Net   1535 ml   Filed Weights   12/04/12 0836 12/04/12 1300  Weight: 51.71 kg (114 lb) 51.3 kg (113 lb 1.5 oz)    Exam:   General:  WDWN female appearing stated age  Cardiovascular: RRR, no gallops, or rubs.   Mitral valve prolapse heard over left sternal border at 5th intercostal space and over apex of heart.  Respiratory: Clear to auscultation bilaterally.  Abdomen: Soft, non-tender, bowel sounds heard in all 4 quadrants.  Musculoskeletal: No peripheral edema, normal ROM in upper/lower extremities.  Skin: Janeway lesion on left palm has resided.  New non-tender, flat, erythematous lesions measuring less than 1mm found on anterior lower legs and dorsal surface of feet bilaterally.   Data Reviewed: Basic Metabolic Panel:  Recent Labs Lab 12/02/12 1808 12/04/12 0905 12/04/12 1300 12/05/12 0509  NA 135 134*  --  137  K 3.9 3.7  --  4.0  CL 100 99  --  103  CO2 25 23  --  25  GLUCOSE 100* 153*  --  99  BUN 13 12  --  14  CREATININE 0.84 0.89 0.87 0.93  CALCIUM 9.0 9.3  --  8.7   Liver Function Tests:  Recent Labs Lab 12/02/12 1808 12/04/12 0905 12/05/12 0509  AST 25 24 25   ALT 19 17 16   ALKPHOS 69 69 66  BILITOT 0.6 0.5 0.3  PROT 7.1 7.2 6.6  ALBUMIN 3.5 2.8* 2.5*   No results found for this basename: LIPASE, AMYLASE,  in the last 168 hours No results found for this basename: AMMONIA,  in the last 168 hours CBC:  Recent Labs Lab 12/02/12 1808 12/04/12 0905 12/04/12 1300 12/05/12 0509  WBC 9.5 10.0 8.4 7.1  NEUTROABS 7.4  --   --   --   HGB 11.5* 11.2* 11.2* 10.4*  HCT 34.0* 33.1* 33.2* 30.9*  MCV 85.9 87.6 87.6 88.3  PLT 246 223 215 215   Cardiac Enzymes: No results found for this basename: CKTOTAL, CKMB, CKMBINDEX, TROPONINI,  in the last 168 hours BNP (last 3 results) No results found for this basename: PROBNP,  in the last 8760 hours CBG: No results found for this basename: GLUCAP,  in the last 168 hours  Recent Results (from the past 240 hour(s))  CULTURE, BLOOD (SINGLE)     Status: None   Collection Time    12/02/12  6:10 PM      Result Value Range Status   Organism ID, Bacteria STREPTOCOCCUS SPECIES   Final   Comment: Susceptibilities performed on  previous culture     within the last 5 days.     IDENTIFIED AS STREPTOCOCCUS MUTANS     Gram Stain Report Called to,Read Back By     and Verified With:     NEEMA SHARDA ON 12/03/2012 AT 9:21P BY WILEJ  CULTURE, BLOOD (SINGLE)     Status: None   Collection Time    12/02/12  6:24 PM      Result Value Range Status   Culture STREPTOCOCCUS SPECIES   Final   Organism ID, Bacteria STREPTOCOCCUS SPECIES   Final   Comment: IDENTIFIED AS STREPTOCOCCUS MUTANS     Gram Stain Report Called to,Read Back By     and Verified With:     NEEMA SHARDA ON 12/03/2012 AT 9:21P BY WILEJ  CULTURE, BLOOD (ROUTINE X 2)     Status: None   Collection Time    12/04/12  8:58 AM      Result Value Range Status   Specimen Description BLOOD RIGHT ARM   Final   Special Requests BOTTLES DRAWN AEROBIC ONLY 10CC   Final   Culture  Setup Time     Final   Value: 12/04/2012 16:27     Performed at [facility]   Culture     Final   Value: STREPTOCOCCUS SPECIES     Note: SUSCEPTIBILITIES PERFORMED ON PREVIOUS CULTURE WITHIN THE LAST 5 DAYS. IDENTIFIED AS STREP MUTANS     Note: Gram Stain Report Called to,Read Back By and Verified With: ARIELLE MOHAMMED 12/05/12 @ 8:25PM BY RUSCOE A.     Performed at [facility]   Report Status 12/07/2012 FINAL   Final  CULTURE, BLOOD (ROUTINE X 2)     Status: None   Collection Time    12/04/12  9:05 AM      Result Value Range Status   Specimen Description BLOOD HAND RIGHT   Final   Special Requests BOTTLES DRAWN AEROBIC ONLY 5CC   Final   Culture  Setup Time     Final   Value: 12/04/2012 16:27     Performed at [facility]   Culture     Final   Value: STREPTOCOCCUS SPECIES     Note: IDENTIFIED AS STREPTOCOCCUS MUTANS SUSCEPTIBILITIES PERFORMED ON PREVIOUS CULTURE WITHIN THE LAST 5 DAYS.     Note: Gram Stain Report Called to,Read Back By and Verified With: RIELLE MOHAMMED 12/05/12 @ 8:25PM BY RUSCOE A.     Performed at [facility]   Report Status 12/07/2012  FINAL   Final  URINE CULTURE     Status: None   Collection Time    12/04/12  9:54 AM      Result Value Range Status   Specimen Description URINE, CLEAN CATCH   Final   Special Requests NONE   Final   Culture  Setup Time     Final   Value: 12/04/2012 10:22     Performed at [facility] Count     Final   Value: NO GROWTH     Performed at [facility]   Culture     Final   Value: NO GROWTH     Performed at [facility]   Report Status 12/05/2012 FINAL   Final  CULTURE, BLOOD (ROUTINE X 2)     Status: None   Collection Time    12/04/12 11:15 PM      Result Value Range Status   Specimen Description BLOOD RIGHT ARM   Final   Special Requests BOTTLES DRAWN AEROBIC ONLY 5CC   Final   Culture  Setup Time     Final   Value: 12/05/2012 18:25     Performed at [facility]   Culture     Final   Value:        BLOOD CULTURE RECEIVED NO GROWTH TO DATE CULTURE WILL BE HELD FOR 5 DAYS BEFORE ISSUING A FINAL NEGATIVE REPORT     Performed at [facility]   Report Status PENDING   Incomplete  CULTURE, BLOOD (ROUTINE X 2)     Status: None   Collection Time    12/04/12 11:30 PM      Result Value Range Status   Specimen Description BLOOD RIGHT HAND   Final   Special Requests BOTTLES DRAWN AEROBIC ONLY 5CC   Final   Culture  Setup Time     Final   Value: 12/05/2012 18:24     Performed at [facility]   Culture     Final   Value:        BLOOD CULTURE RECEIVED NO GROWTH TO DATE CULTURE WILL BE HELD FOR 5 DAYS BEFORE ISSUING A FINAL NEGATIVE REPORT     Performed at [facility]   Report Status PENDING   Incomplete  CULTURE, BLOOD (ROUTINE X 2)     Status: None   Collection Time    12/07/12  9:35 AM      Result Value Range Status   Specimen Description BLOOD LEFT ANTECUBITAL   Final   Special Requests BOTTLES DRAWN AEROBIC AND ANAEROBIC 10CC   Final   Culture  Setup Time     Final   Value: 12/07/2012 16:44     Performed at [facility]   Culture     Final   Value:        BLOOD CULTURE RECEIVED NO GROWTH TO DATE CULTURE WILL BE HELD FOR 5 DAYS BEFORE ISSUING A FINAL NEGATIVE REPORT     Performed at [facility]   Report Status PENDING   Incomplete  CULTURE, BLOOD (ROUTINE X 2)     Status: None   Collection Time    12/07/12  9:40 AM      Result Value Range Status   Specimen Description BLOOD LEFT HAND   Final   Special Requests BOTTLES DRAWN AEROBIC AND ANAEROBIC 10CC   Final   Culture  Setup Time     Final   Value: 12/07/2012 16:44     Performed at [facility]   Culture     Final   Value:        BLOOD CULTURE RECEIVED NO GROWTH TO DATE CULTURE WILL BE HELD FOR 5 DAYS BEFORE ISSUING A FINAL NEGATIVE REPORT     Performed  at [facility]   Report Status PENDING   Incomplete    Blood Culture obtained 12/07/12 preliminary report is negative.  Will allow growth for 5 more days for final report.  Studies: TEE 12/07/12 revealed myxomatous mitral valve with prolapse, thickened mitral valve leaflets with moderate regurgitation, tricuspid valve prolapse with moderate regurgitation.  EF 55-60%.  Scheduled Meds: . aspirin  81 mg Oral Daily  . cefTRIAXone (ROCEPHIN)  IV  2 g Intravenous Q24H  . doxepin  50 mg Oral QHS  . heparin  5,000 Units Subcutaneous Q8H  . verapamil  240 mg Oral Daily   Continuous Infusions: . sodium chloride 100 mL/hr at 12/08/12 0254    Joycelyn Man, PA-S  [facility] If 7PM-7AM, please contact night-coverage at [email or web address], password [facility] 12/08/2012, 1:03 PM  LOS: 4 days   Attending note:  PICC ordered by ID.  Likely home tomorrow.   Crista Curb, M.D.

## 2012-12-08 NOTE — Discharge Summary (Signed)
Physician Discharge Summary  Lisa Cabrera AVW:[id number] DOB: [birth year] DOA: 12/04/2012  PCP: Daisy Floro, MD  Admit date: 12/04/2012 Discharge date: 12/09/12  Time spent: 45 minutes  Recommendations for Outpatient Follow-up:  1.  Will have home health nursing for PICC line care and antibiotic administration. 2.  Follow up with Dr. Drue Second in ID in 2 weeks.  3.   Follow up with PCP in 2 month.  Discharge Diagnoses:  Principal Problem:   Streptococcal bacteremia Active Problems:   Normocytic anemia   Hyperglycemia   Myxomatous mitral valve   Latent tuberculosis (Gold quantifrton positive)  Discharge Condition: Stable  Diet recommendation: Regular  Filed Weights   12/04/12 0836 12/04/12 1300  Weight: 51.71 kg (114 lb) 51.3 kg (113 lb 1.5 oz)    History of present illness at time of admission:  Lisa Cabrera is a 67 y.o. female with a history of a murmur, spinal surgery. She has had 2 bouts of illness in the last 6 months. Initial one was treated with azithromycin and the fevers went away, the second one in July she was treated with Levaquin which improved for one week and then after that the fevers and fatigue returned.  She was referred to the infectious disease doctor during this time she was having temperatures of 101 on a daily basis usually in the evening. She usually wakes up sweating takes Tylenol with improvement of her fevers. Mild unintentional weight loss and a dry cough. A PPD was done which was negative, chest x-ray and ANA were negative. The infectious disease doctor get blood cultures and 12/03/2003 one out of two blood cultures came back gram-positive cocci in chains.Marland Kitchen SPEP was negative, ESR and CRP were elevated CMV titers were negative UA did not show any infection.   Hospital Course:    Streptococcal Mutans Bacterial Endocarditis-   Patient was admitted directly from the infectious disease office, she was initially placed on IV vancomycin. Infectious  disease was consulted, TEE was subsequently done, she has diffuse maximum is changes in the mitral valve, without any vegetations. It was decided we treat her as if she has infectious endocarditis. Current plans are to continue with IV Rocephin 2g/24hr until 01/04/13 at home through PICC line.  [facility] will follow up and care for PICC line and give antibiotic treatment.   Follow up with ID in 2 weeks for repeat blood cultures, and with PCP in 1 month.  Inform dentist of current admission and cause for future prophylaxis prior to any dental procedures.  Normocytic anemia-  Stable for PCP monitoring  Myxomatous Mitral Valve with prolapse  Stable for PCP monitoring  Latent tuberculosis (Gold quantifrton positive) - Would defer further workup to the infectious disease clinic  Procedures: TEE 12/07/12 Dr. Royann Shivers  FINDINGS:  No vegetations are seen.  Marked myxomatous degeneration of the mitral leaflets.  Mitral valve prolapse, especially of the A3 segment of the anterior leaflet.  Highly eccentric, mild to moderate mitral insufficiency.  Tricuspid valve prolapse.  Mild to moderate central tricuspid insufficiency  Normal LV function.  Normal LA size.  No aortic abnormalities.  Consultations:  ID  Cardiology  Discharge Exam: Filed Vitals:   12/09/12 0514  BP: 137/73  Pulse: 80  Temp: 98.4 F (36.9 C)  Resp: 18    General: WDWN female in no apparent distress Cardiovascular: RRR, no gallops, rubs.  2/6 murmur with audible with click Respiratory: Clear to auscultation bilaterally Abdomen: Soft, non-tender, bowel sounds heard in all  4 quadrants Musculoskeletal: No peripheral edema, normal ROM, symmetrical strength Skin: All lesions have cleared except a 2 mm small, flat, irregular bordered, erythematous macule on the dorsal aspect of the right foot.  Discharge Instructions      Discharge Orders   Future Appointments Provider Department Dept Phone    12/22/2012 2:45 PM Judyann Munson, MD [facility] [phone]   Future Orders Complete By Expires   Call MD for:  temperature >100.4  As directed    Diet - low sodium heart healthy  As directed    Increase activity slowly  As directed        Medication List         aspirin 81 MG tablet  Take 81 mg by mouth daily.     dextrose 5 % SOLN 50 mL with cefTRIAXone 2 G SOLR 2 g  Inject 2 g into the vein daily.     doxepin 50 MG capsule  Commonly known as:  SINEQUAN  Take 50 mg by mouth at bedtime.     FISH OIL PO  Take by mouth daily.     MULTIVITAMIN PO  Take by mouth daily.     verapamil 240 MG 24 hr capsule  Commonly known as:  VERELAN PM  Take 240 mg by mouth daily.     Vitamin D 2000 UNITS Caps  Take by mouth daily.       No Known Allergies Follow-up Information   Follow up with Judyann Munson, MD In 2 weeks.   Specialty:  Infectious Diseases   Contact information:   [address] [place] [address] [phone]       Follow up with Daisy Floro, MD. Schedule an appointment as soon as possible for a visit in 2 weeks.   Specialty:  Family Medicine   Contact information:   [address] [place] [address] [phone]        The results of significant diagnostics from this hospitalization (including imaging, microbiology, ancillary and laboratory) are listed below for reference.    Significant Diagnostic Studies: Dg Chest 2 View  12/04/2012   *RADIOLOGY REPORT*  Clinical Data: Fever  CHEST - 2 VIEW  Comparison: 11/19/2012  Findings: Moderate dextroscoliosis of the thoracic spine.  The lungs are clear without infiltrate or effusion.  Negative for heart failure.  No change from the prior study.  IMPRESSION: No active cardiopulmonary abnormality.   Original Report Authenticated By: Janeece Riggers, M.D.    Microbiology: Recent Results (from the past 240 hour(s))  CULTURE, BLOOD (SINGLE)      Status: None   Collection Time    12/02/12  6:10 PM      Result Value Range Status   Organism ID, Bacteria STREPTOCOCCUS SPECIES   Final   Comment: Susceptibilities performed on previous culture     within the last 5 days.     IDENTIFIED AS STREPTOCOCCUS MUTANS     Gram Stain Report Called to,Read Back By     and Verified With:     NEEMA SHARDA ON 12/03/2012 AT 9:21P BY WILEJ  CULTURE, BLOOD (SINGLE)     Status: None   Collection Time    12/02/12  6:24 PM      Result Value Range Status   Culture STREPTOCOCCUS SPECIES   Final   Organism ID, Bacteria STREPTOCOCCUS SPECIES   Final   Comment: IDENTIFIED AS STREPTOCOCCUS MUTANS     Gram Stain Report Called  to,Read Back By     and Verified With:     NEEMA SHARDA ON 12/03/2012 AT 9:21P BY WILEJ  CULTURE, BLOOD (ROUTINE X 2)     Status: None   Collection Time    12/04/12  8:58 AM      Result Value Range Status   Specimen Description BLOOD RIGHT ARM   Final   Special Requests BOTTLES DRAWN AEROBIC ONLY 10CC   Final   Culture  Setup Time     Final   Value: 12/04/2012 16:27     Performed at [facility]   Culture     Final   Value: STREPTOCOCCUS SPECIES     Note: SUSCEPTIBILITIES PERFORMED ON PREVIOUS CULTURE WITHIN THE LAST 5 DAYS. IDENTIFIED AS STREP MUTANS     Note: Gram Stain Report Called to,Read Back By and Verified With: ARIELLE MOHAMMED 12/05/12 @ 8:25PM BY RUSCOE A.     Performed at [facility]   Report Status 12/07/2012 FINAL   Final  CULTURE, BLOOD (ROUTINE X 2)     Status: None   Collection Time    12/04/12  9:05 AM      Result Value Range Status   Specimen Description BLOOD HAND RIGHT   Final   Special Requests BOTTLES DRAWN AEROBIC ONLY 5CC   Final   Culture  Setup Time     Final   Value: 12/04/2012 16:27     Performed at [facility]   Culture     Final   Value: STREPTOCOCCUS SPECIES     Note: IDENTIFIED AS STREPTOCOCCUS MUTANS SUSCEPTIBILITIES PERFORMED ON PREVIOUS CULTURE WITHIN THE LAST 5 DAYS.      Note: Gram Stain Report Called to,Read Back By and Verified With: RIELLE MOHAMMED 12/05/12 @ 8:25PM BY RUSCOE A.     Performed at [facility]   Report Status 12/07/2012 FINAL   Final  URINE CULTURE     Status: None   Collection Time    12/04/12  9:54 AM      Result Value Range Status   Specimen Description URINE, CLEAN CATCH   Final   Special Requests NONE   Final   Culture  Setup Time     Final   Value: 12/04/2012 10:22     Performed at [facility] Count     Final   Value: NO GROWTH     Performed at [facility]   Culture     Final   Value: NO GROWTH     Performed at [facility]   Report Status 12/05/2012 FINAL   Final  CULTURE, BLOOD (ROUTINE X 2)     Status: None   Collection Time    12/04/12 11:15 PM      Result Value Range Status   Specimen Description BLOOD RIGHT ARM   Final   Special Requests BOTTLES DRAWN AEROBIC ONLY 5CC   Final   Culture  Setup Time     Final   Value: 12/05/2012 18:25     Performed at [facility]   Culture     Final   Value:        BLOOD CULTURE RECEIVED NO GROWTH TO DATE CULTURE WILL BE HELD FOR 5 DAYS BEFORE ISSUING A FINAL NEGATIVE REPORT     Performed at [facility]   Report Status PENDING   Incomplete  CULTURE, BLOOD (ROUTINE X 2)     Status: None  Collection Time    12/04/12 11:30 PM      Result Value Range Status   Specimen Description BLOOD RIGHT HAND   Final   Special Requests BOTTLES DRAWN AEROBIC ONLY 5CC   Final   Culture  Setup Time     Final   Value: 12/05/2012 18:24     Performed at [facility]   Culture     Final   Value:        BLOOD CULTURE RECEIVED NO GROWTH TO DATE CULTURE WILL BE HELD FOR 5 DAYS BEFORE ISSUING A FINAL NEGATIVE REPORT     Performed at [facility]   Report Status PENDING   Incomplete  CULTURE, BLOOD (ROUTINE X 2)     Status: None   Collection Time    12/07/12  9:35 AM      Result Value Range Status   Specimen Description  BLOOD LEFT ANTECUBITAL   Final   Special Requests BOTTLES DRAWN AEROBIC AND ANAEROBIC 10CC   Final   Culture  Setup Time     Final   Value: 12/07/2012 16:44     Performed at [facility]   Culture     Final   Value:        BLOOD CULTURE RECEIVED NO GROWTH TO DATE CULTURE WILL BE HELD FOR 5 DAYS BEFORE ISSUING A FINAL NEGATIVE REPORT     Performed at [facility]   Report Status PENDING   Incomplete  CULTURE, BLOOD (ROUTINE X 2)     Status: None   Collection Time    12/07/12  9:40 AM      Result Value Range Status   Specimen Description BLOOD LEFT HAND   Final   Special Requests BOTTLES DRAWN AEROBIC AND ANAEROBIC 10CC   Final   Culture  Setup Time     Final   Value: 12/07/2012 16:44     Performed at [facility]   Culture     Final   Value:        BLOOD CULTURE RECEIVED NO GROWTH TO DATE CULTURE WILL BE HELD FOR 5 DAYS BEFORE ISSUING A FINAL NEGATIVE REPORT     Performed at [facility]   Report Status PENDING   Incomplete     Labs: Basic Metabolic Panel:  Recent Labs Lab 12/02/12 1808 12/04/12 0905 12/04/12 1300 12/05/12 0509  NA 135 134*  --  137  K 3.9 3.7  --  4.0  CL 100 99  --  103  CO2 25 23  --  25  GLUCOSE 100* 153*  --  99  BUN 13 12  --  14  CREATININE 0.84 0.89 0.87 0.93  CALCIUM 9.0 9.3  --  8.7   Liver Function Tests:  Recent Labs Lab 12/02/12 1808 12/04/12 0905 12/05/12 0509  AST 25 24 25   ALT 19 17 16   ALKPHOS 69 69 66  BILITOT 0.6 0.5 0.3  PROT 7.1 7.2 6.6  ALBUMIN 3.5 2.8* 2.5*   CBC:  Recent Labs Lab 12/02/12 1808 12/04/12 0905 12/04/12 1300 12/05/12 0509  WBC 9.5 10.0 8.4 7.1  NEUTROABS 7.4  --   --   --   HGB 11.5* 11.2* 11.2* 10.4*  HCT 34.0* 33.1* 33.2* 30.9*  MCV 85.9 87.6 87.6 88.3  PLT 246 223 215 215    Signed:  Joycelyn Man, PA-S  [facility] 12/09/2012, 10:24 AM

## 2012-12-09 DIAGNOSIS — R7881 Bacteremia: Secondary | ICD-10-CM | POA: Diagnosis not present

## 2012-12-09 DIAGNOSIS — D649 Anemia, unspecified: Secondary | ICD-10-CM | POA: Diagnosis not present

## 2012-12-09 DIAGNOSIS — Z79899 Other long term (current) drug therapy: Secondary | ICD-10-CM | POA: Diagnosis not present

## 2012-12-09 DIAGNOSIS — R509 Fever, unspecified: Secondary | ICD-10-CM | POA: Diagnosis not present

## 2012-12-09 DIAGNOSIS — I330 Acute and subacute infective endocarditis: Secondary | ICD-10-CM | POA: Diagnosis not present

## 2012-12-09 DIAGNOSIS — A498 Other bacterial infections of unspecified site: Secondary | ICD-10-CM | POA: Diagnosis not present

## 2012-12-09 DIAGNOSIS — Z452 Encounter for adjustment and management of vascular access device: Secondary | ICD-10-CM | POA: Diagnosis not present

## 2012-12-09 DIAGNOSIS — I059 Rheumatic mitral valve disease, unspecified: Secondary | ICD-10-CM | POA: Diagnosis not present

## 2012-12-09 MED ORDER — DEXTROSE 5 % IV SOLN: 2.0000 g | INTRAVENOUS | Status: DC

## 2012-12-09 NOTE — Progress Notes (Signed)
NURSING PROGRESS NOTE  Lisa Cabrera [id number] Discharge Data: 12/09/2012 10:42 AM Attending Provider: Maretta Bees, MD NWG:NFAO,[id number] Hessie Diener, MD     Barnabas Lister to be D/C'd Home per MD order.  Discussed with the patient the After Visit Summary and all questions fully answered. Pt to d/c with PICC line for home treatments. All belongings returned to patient for patient to take home.   Last Vital Signs:  Blood pressure 137/73, pulse 80, temperature 98.4 F (36.9 C), temperature source Oral, resp. rate 18, height 5\' 1"  (1.549 m), weight 51.3 kg (113 lb 1.5 oz), SpO2 98.00%.  Discharge Medication List   Medication List         aspirin 81 MG tablet  Take 81 mg by mouth daily.     dextrose 5 % SOLN 50 mL with cefTRIAXone 2 G SOLR 2 g  Inject 2 g into the vein daily.     doxepin 50 MG capsule  Commonly known as:  SINEQUAN  Take 50 mg by mouth at bedtime.     FISH OIL PO  Take by mouth daily.     MULTIVITAMIN PO  Take by mouth daily.     verapamil 240 MG 24 hr capsule  Commonly known as:  VERELAN PM  Take 240 mg by mouth daily.     Vitamin D 2000 UNITS Caps  Take by mouth daily.

## 2012-12-11 LAB — CULTURE, BLOOD (ROUTINE X 2): Culture: NO GROWTH

## 2012-12-13 DIAGNOSIS — A498 Other bacterial infections of unspecified site: Secondary | ICD-10-CM | POA: Diagnosis not present

## 2012-12-13 DIAGNOSIS — Z792 Long term (current) use of antibiotics: Secondary | ICD-10-CM | POA: Diagnosis not present

## 2012-12-13 DIAGNOSIS — Z452 Encounter for adjustment and management of vascular access device: Secondary | ICD-10-CM | POA: Diagnosis not present

## 2012-12-13 DIAGNOSIS — D649 Anemia, unspecified: Secondary | ICD-10-CM | POA: Diagnosis not present

## 2012-12-13 DIAGNOSIS — I330 Acute and subacute infective endocarditis: Secondary | ICD-10-CM | POA: Diagnosis not present

## 2012-12-13 DIAGNOSIS — Z79899 Other long term (current) drug therapy: Secondary | ICD-10-CM | POA: Diagnosis not present

## 2012-12-13 LAB — CULTURE, BLOOD (ROUTINE X 2)

## 2012-12-20 DIAGNOSIS — Z79899 Other long term (current) drug therapy: Secondary | ICD-10-CM | POA: Diagnosis not present

## 2012-12-20 DIAGNOSIS — Z452 Encounter for adjustment and management of vascular access device: Secondary | ICD-10-CM | POA: Diagnosis not present

## 2012-12-20 DIAGNOSIS — I330 Acute and subacute infective endocarditis: Secondary | ICD-10-CM | POA: Diagnosis not present

## 2012-12-20 DIAGNOSIS — D649 Anemia, unspecified: Secondary | ICD-10-CM | POA: Diagnosis not present

## 2012-12-20 DIAGNOSIS — A498 Other bacterial infections of unspecified site: Secondary | ICD-10-CM | POA: Diagnosis not present

## 2012-12-22 ENCOUNTER — Ambulatory Visit (INDEPENDENT_AMBULATORY_CARE_PROVIDER_SITE_OTHER): Payer: Medicare Other | Admitting: Internal Medicine

## 2012-12-22 ENCOUNTER — Encounter: Payer: Self-pay | Admitting: Internal Medicine

## 2012-12-22 VITALS — BP 155/69 | HR 92 | Temp 97.6°F | Wt 113.0 lb

## 2012-12-22 DIAGNOSIS — R197 Diarrhea, unspecified: Secondary | ICD-10-CM

## 2012-12-22 DIAGNOSIS — R7881 Bacteremia: Secondary | ICD-10-CM

## 2012-12-22 DIAGNOSIS — A491 Streptococcal infection, unspecified site: Secondary | ICD-10-CM | POA: Diagnosis not present

## 2012-12-22 DIAGNOSIS — B955 Unspecified streptococcus as the cause of diseases classified elsewhere: Secondary | ICD-10-CM

## 2012-12-22 NOTE — Progress Notes (Signed)
[facility] NOTE  RFV: hospital follow up on presumed endocarditis Subjective:    Patient ID: Lisa Cabrera, female    DOB: 1945/08/25, 67 y.o.   MRN: [id number]  HPI 67yo F who was seen for FUO found to have strep mutans bacteremia. In endocarditis work up, her TEE showed that she had a myxomatous mitral valve. She was discharged on 4 wks of ceftriaxone 2gm IV daily for subacute streptococcal endocarditis. Since being home she had been fine but recently developed nightsweats x 3-4 days but no associated fevers. She is also having ongoing rash  Current Outpatient Prescriptions on File Prior to Visit  Medication Sig Dispense Refill  . aspirin 81 MG tablet Take 81 mg by mouth daily.      . Cholecalciferol (VITAMIN D) 2000 UNITS CAPS Take by mouth daily.      Marland Kitchen dextrose 5 % SOLN 50 mL with cefTRIAXone 2 G SOLR 2 g Inject 2 g into the vein daily.  26 each  0  . doxepin (SINEQUAN) 50 MG capsule Take 50 mg by mouth at bedtime.      . Multiple Vitamins-Minerals (MULTIVITAMIN PO) Take by mouth daily.      . Omega-3 Fatty Acids (FISH OIL PO) Take by mouth daily.      . verapamil (VERELAN PM) 240 MG 24 hr capsule Take 240 mg by mouth daily.       No current facility-administered medications on file prior to visit.   Active Ambulatory Problems    Diagnosis Date Noted  . Streptococcal bacteremia 12/05/2012  . Normocytic anemia 12/05/2012  . Hyperglycemia 12/05/2012  . 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 Fever of unknown origin 12/03/2012  . Bacteremia 12/04/2012  . Fever 12/04/2012   Past Medical History  Diagnosis Date  . H/O seasonal allergies   . Heart murmur   . Complication of anesthesia   . Family history of anesthesia complication   . Anemia       Review of Systems Review of Systems  Constitutional: Negative for fever, chills, diaphoresis, activity change, appetite change, fatigue and unexpected weight change.   HENT: Negative for congestion, sore throat, rhinorrhea, sneezing, trouble swallowing and sinus pressure.  Eyes: Negative for photophobia and visual disturbance.  Respiratory: Negative for cough, chest tightness, shortness of breath, wheezing and stridor.  Cardiovascular: Negative for chest pain, palpitations and leg swelling.  Gastrointestinal: Negative for nausea, vomiting, abdominal pain, diarrhea, constipation, blood in stool, abdominal distention and anal bleeding.  Genitourinary: Negative for dysuria, hematuria, flank pain and difficulty urinating.  Musculoskeletal: Negative for myalgias, back pain, joint swelling, arthralgias and gait problem.  Skin: Negative for color change, pallor, rash and wound.  Neurological: Negative for dizziness, tremors, weakness and light-headedness.  Hematological: Negative for adenopathy. Does not bruise/bleed easily.  Psychiatric/Behavioral: Negative for behavioral problems, confusion, sleep disturbance, dysphoric mood, decreased concentration and agitation.       Objective:   Physical Exam BP 155/69  Pulse 92  Temp(Src) 97.6 F (36.4 C) (Oral)  Wt 113 lb (51.256 kg)  BMI 21.36 kg/m2 Physical Exam  Constitutional:  oriented to person, place, and time.  appears well-developed and well-nourished. No distress.  HENT:  Mouth/Throat: Oropharynx is clear and moist. No oropharyngeal exudate.  Cardiovascular: Normal rate, regular rhythm and normal heart sounds. Exam reveals no gallop and no friction rub.  No murmur heard.  Pulmonary/Chest: Effort normal and breath sounds normal. No respiratory distress.  He has no wheezes.  Abdominal: Soft. Bowel sounds are normal. He exhibits no distension. There is no tenderness.  Lymphadenopathy:  no cervical adenopathy.  Neurological:  alert and oriented to person, place, and time.  Skin: scattered non blanching macules on arms, legs, and chest, but also has telanchetasia lesions on legs Psychiatric:  a normal mood  and affect.  behavior is normal.       Assessment & Plan:  Streptococcal subacute endocarditis = continue with ceftriaxone 2gm IV daily until oct 6th to finish 4 wk course of therapy.  Nightsweats= no temp greater than 99.9; will recheck blood cx today incase she may have PICC line associated infection  Macular rash?/ongoing janeway lesions = concern that this is embolic phenomenon. May have her follow up with cardiology to see if need to repeat echo.  Diarrhea = appears to be antibiotic associated but we will rule out for c.difficile  rtc on 10/6

## 2012-12-24 DIAGNOSIS — I38 Endocarditis, valve unspecified: Secondary | ICD-10-CM | POA: Diagnosis not present

## 2012-12-24 DIAGNOSIS — Z23 Encounter for immunization: Secondary | ICD-10-CM | POA: Diagnosis not present

## 2012-12-24 DIAGNOSIS — T148XXA Other injury of unspecified body region, initial encounter: Secondary | ICD-10-CM | POA: Diagnosis not present

## 2012-12-25 DIAGNOSIS — Z79899 Other long term (current) drug therapy: Secondary | ICD-10-CM | POA: Diagnosis not present

## 2012-12-25 DIAGNOSIS — D649 Anemia, unspecified: Secondary | ICD-10-CM | POA: Diagnosis not present

## 2012-12-25 DIAGNOSIS — A498 Other bacterial infections of unspecified site: Secondary | ICD-10-CM | POA: Diagnosis not present

## 2012-12-25 DIAGNOSIS — I330 Acute and subacute infective endocarditis: Secondary | ICD-10-CM | POA: Diagnosis not present

## 2012-12-25 DIAGNOSIS — Z452 Encounter for adjustment and management of vascular access device: Secondary | ICD-10-CM | POA: Diagnosis not present

## 2012-12-27 DIAGNOSIS — I330 Acute and subacute infective endocarditis: Secondary | ICD-10-CM | POA: Diagnosis not present

## 2012-12-27 DIAGNOSIS — Z792 Long term (current) use of antibiotics: Secondary | ICD-10-CM | POA: Diagnosis not present

## 2012-12-27 DIAGNOSIS — A498 Other bacterial infections of unspecified site: Secondary | ICD-10-CM | POA: Diagnosis not present

## 2012-12-27 DIAGNOSIS — Z79899 Other long term (current) drug therapy: Secondary | ICD-10-CM | POA: Diagnosis not present

## 2012-12-27 DIAGNOSIS — Z452 Encounter for adjustment and management of vascular access device: Secondary | ICD-10-CM | POA: Diagnosis not present

## 2012-12-27 DIAGNOSIS — D649 Anemia, unspecified: Secondary | ICD-10-CM | POA: Diagnosis not present

## 2012-12-30 ENCOUNTER — Encounter: Payer: Self-pay | Admitting: Internal Medicine

## 2012-12-31 ENCOUNTER — Ambulatory Visit (INDEPENDENT_AMBULATORY_CARE_PROVIDER_SITE_OTHER): Payer: Medicare Other | Admitting: Internal Medicine

## 2012-12-31 ENCOUNTER — Encounter: Payer: Self-pay | Admitting: Internal Medicine

## 2012-12-31 VITALS — BP 124/70 | HR 112 | Temp 98.8°F | Wt 116.0 lb

## 2012-12-31 DIAGNOSIS — I330 Acute and subacute infective endocarditis: Secondary | ICD-10-CM | POA: Diagnosis not present

## 2012-12-31 DIAGNOSIS — R7881 Bacteremia: Secondary | ICD-10-CM

## 2012-12-31 LAB — CBC WITH DIFFERENTIAL/PLATELET
Basophils Relative: 1 % (ref 0–1)
Eosinophils Absolute: 0.3 10*3/uL (ref 0.0–0.7)
Eosinophils Relative: 5 % (ref 0–5)
HCT: 32.5 % — ABNORMAL LOW (ref 36.0–46.0)
Lymphocytes Relative: 12 % (ref 12–46)
MCHC: 33.8 g/dL (ref 30.0–36.0)
MCV: 86.7 fL (ref 78.0–100.0)
Monocytes Absolute: 0.5 10*3/uL (ref 0.1–1.0)
RDW: 15.1 % (ref 11.5–15.5)
WBC: 6.5 10*3/uL (ref 4.0–10.5)

## 2012-12-31 NOTE — Progress Notes (Signed)
Order received for dressing change to PICC line.  Sterile dressing change, cleansed PICC insertion with betadine and alcohol swabs.  Removed dried blood from site.  Replaced Statlock device.  Covered area with sterile Tegaderm film.  Pt tolerated dressing change without complaint.

## 2012-12-31 NOTE — Progress Notes (Signed)
[facility] NOTE  RFV: fever Subjective:    Patient ID: Lisa Cabrera, female    DOB: July 27, 1945, 67 y.o.   MRN: [id number]  HPI 67 yo F with strep mutans bacteremia presented as FUO. She was seen last week when she had been on ceftriaxone roughly on 3 of 4 wks of therapy and doing well overall. She states that she has now noticed onset of fever of 100.5,  last night and nightsweats. She denies having chills or pain with the infusion of her antibiotics. Still has scattered rash. +fatigue still ongoing.  Current Outpatient Prescriptions on File Prior to Visit  Medication Sig Dispense Refill  . aspirin 81 MG tablet Take 81 mg by mouth daily.      . Cholecalciferol (VITAMIN D) 2000 UNITS CAPS Take by mouth daily.      Marland Kitchen dextrose 5 % SOLN 50 mL with cefTRIAXone 2 G SOLR 2 g Inject 2 g into the vein daily.  26 each  0  . doxepin (SINEQUAN) 50 MG capsule Take 50 mg by mouth at bedtime.      . Multiple Vitamins-Minerals (MULTIVITAMIN PO) Take by mouth daily.      . Omega-3 Fatty Acids (FISH OIL PO) Take by mouth daily.      . verapamil (VERELAN PM) 240 MG 24 hr capsule Take 240 mg by mouth daily.       No current facility-administered medications on file prior to visit.   Active Ambulatory Problems    Diagnosis Date Noted  . Streptococcal bacteremia 12/05/2012  . Normocytic anemia 12/05/2012  . Hyperglycemia 12/05/2012  . 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 Fever of unknown origin 12/03/2012  . Bacteremia 12/04/2012  . Fever 12/04/2012   Past Medical History  Diagnosis Date  . H/O seasonal allergies   . Heart murmur   . Complication of anesthesia   . Family history of anesthesia complication   . Anemia      Review of Systems Positive pertinents and negative pertinents listed in hpi    Objective:   Physical Exam BP 124/70  Pulse 112  Temp(Src) 98.8 F (37.1 C) (Oral)  Wt 116 lb (52.617 kg)  BMI 21.93  kg/m2 Physical Exam  Constitutional: oriented to person, place, and time. appears well-developed and well-nourished. No distress.  HENT:  Mouth/Throat: Oropharynx is clear and moist. No oropharyngeal exudate.  Cardiovascular: Normal rate, regular rhythm and normal heart sounds. Exam reveals no gallop and no friction rub.  No murmur heard.  Pulmonary/Chest: Effort normal and breath sounds normal.  respiratory distress.  no wheezes.  Lymphadenopathy:   no cervical adenopathy.  Neurological:  alert and oriented to person, place, and time.  Skin: Skin is warm and dry. No rash noted. No erythema.  Ext: no erythema to picc     Assessment & Plan:  Febrile episode last night = unclear etiology. Will get cbc with diff, blood cultures. Consider to pull picc line if fevers persist over the next 1-2 days, then culture tip. And last few days of therapy can do oral antibiotics.  Endocarditis = repeat TTE at completion of treatment

## 2013-01-01 ENCOUNTER — Telehealth: Payer: Self-pay | Admitting: *Deleted

## 2013-01-01 ENCOUNTER — Ambulatory Visit (INDEPENDENT_AMBULATORY_CARE_PROVIDER_SITE_OTHER): Payer: Medicare Other | Admitting: *Deleted

## 2013-01-01 DIAGNOSIS — R7881 Bacteremia: Secondary | ICD-10-CM | POA: Diagnosis not present

## 2013-01-01 LAB — CBC WITH DIFFERENTIAL/PLATELET
Basophils Absolute: 0 10*3/uL (ref 0.0–0.1)
Basophils Relative: 0 % (ref 0–1)
Eosinophils Relative: 6 % — ABNORMAL HIGH (ref 0–5)
HCT: 33.6 % — ABNORMAL LOW (ref 36.0–46.0)
Hemoglobin: 11.5 g/dL — ABNORMAL LOW (ref 12.0–15.0)
Lymphocytes Relative: 14 % (ref 12–46)
Lymphs Abs: 0.8 10*3/uL (ref 0.7–4.0)
MCH: 29.5 pg (ref 26.0–34.0)
MCV: 86.2 fL (ref 78.0–100.0)
Monocytes Absolute: 0.4 10*3/uL (ref 0.1–1.0)
Neutro Abs: 4.3 10*3/uL (ref 1.7–7.7)
RBC: 3.9 MIL/uL (ref 3.87–5.11)
RDW: 15.4 % (ref 11.5–15.5)
WBC: 5.9 10*3/uL (ref 4.0–10.5)

## 2013-01-01 MED ORDER — CEPHALEXIN 500 MG PO CAPS: 500.0000 mg | ORAL_CAPSULE | Freq: Four times a day (QID) | ORAL | Status: DC

## 2013-01-01 NOTE — Addendum Note (Signed)
Addended by: Mariea Clonts D on: 01/01/2013 12:33 PM   Modules accepted: Orders

## 2013-01-01 NOTE — Telephone Encounter (Signed)
Called the patient per Dr Drue Second to have her come to clinic today to have her PICC pulled. Dr Drue Second spoke to RN Angelique Blonder and gave her instructions and patient is coming to have things done about 11 am.

## 2013-01-01 NOTE — Progress Notes (Signed)
Verbal order received from Dr Drue Second for pt to have Blood Cultures x 2, cbc w/ diff drawn, culture PICC catheter tip and to discontinue the patient's PICC line.  Patient identified with name and date of birth. PICC dressing removed, site unremarkable.   PICC line removed using sterile procedure @ 1115. PICC length equal to that noted in patient's hospital chart of 38 cm. Sterile petroleum gauze + sterile 4X4 applied to PICC site, pressure applied for 10 minutes and covered with Medipore tape as a pressure dressing. Patient tolerated procedure without complaints.  Patient instructed to limit use of arm for 1 hour. Patient instructed that the pressure dressing should remain in place for 24 hours. Patient verbalized understanding of these instructions. [facility] notified of PICC d/c.

## 2013-01-04 LAB — CATH TIP CULTURE: Organism ID, Bacteria: NO GROWTH

## 2013-01-05 ENCOUNTER — Ambulatory Visit (INDEPENDENT_AMBULATORY_CARE_PROVIDER_SITE_OTHER): Payer: Medicare Other | Admitting: Internal Medicine

## 2013-01-05 ENCOUNTER — Encounter: Payer: Self-pay | Admitting: Internal Medicine

## 2013-01-05 ENCOUNTER — Other Ambulatory Visit: Payer: Self-pay | Admitting: Licensed Clinical Social Worker

## 2013-01-05 VITALS — BP 123/70 | HR 72 | Temp 97.8°F | Wt 116.0 lb

## 2013-01-05 DIAGNOSIS — I330 Acute and subacute infective endocarditis: Secondary | ICD-10-CM

## 2013-01-05 DIAGNOSIS — R7881 Bacteremia: Secondary | ICD-10-CM

## 2013-01-05 NOTE — Progress Notes (Signed)
[facility] NOTE  RFV: follow up for presumed  Subjective:    Patient ID: Lisa Cabrera, female    DOB: Aug 04, 1945, 67 y.o.   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 She was discharged on 4 wks of ceftriaxone 2gm IV daily to end on 10/6 however she started to have fevers and nightsweats noted on 9/29 with NS and fevers of 100.5+ on evening of 10/1. Since there was concern that her picc line maybe the source of fevers, it had been removed on 10/3, roughly 4 days ago. Since then, her fevers have resolved but she still having mild nighsweats and sporadic rash. Lab work up from 10/3 included cbc with diff plus blood culture. All within normal limits except 6% eos. (tec of 400) and blood cultures from peripheral and cath tip are negative. She still reports haviang loose stools from antibiotics. Last antibiotic dose was this morning. She has scattered small erythamatous papules to chest and left arm.  Current Outpatient Prescriptions on File Prior to Visit  Medication Sig Dispense Refill  . aspirin 81 MG tablet Take 81 mg by mouth daily.      . Cholecalciferol (VITAMIN D) 2000 UNITS CAPS Take by mouth daily.      Marland Kitchen dextrose 5 % SOLN 50 mL with cefTRIAXone 2 G SOLR 2 g Inject 2 g into the vein daily.  26 each  0  . doxepin (SINEQUAN) 50 MG capsule Take 50 mg by mouth at bedtime.      . Multiple Vitamins-Minerals (MULTIVITAMIN PO) Take by mouth daily.      . Omega-3 Fatty Acids (FISH OIL PO) Take by mouth daily.      . verapamil (VERELAN PM) 240 MG 24 hr capsule Take 240 mg by mouth daily.      . cephALEXin (KEFLEX) 500 MG capsule Take 1 capsule (500 mg total) by mouth 4 (four) times daily.  14 capsule  0   No current facility-administered medications on file prior to visit.   Active Ambulatory Problems    Diagnosis Date Noted  . Streptococcal bacteremia 12/05/2012  . Normocytic anemia 12/05/2012  . Hyperglycemia 12/05/2012  . 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 Fever of unknown origin 12/03/2012  . Bacteremia 12/04/2012  . Fever 12/04/2012   Past Medical History  Diagnosis Date  . H/O seasonal allergies   . Heart murmur   . Complication of anesthesia   . Family history of anesthesia complication   . Anemia    History  Substance Use Topics  . Smoking status: Never Smoker   . Smokeless tobacco: Never Used  . Alcohol Use: Yes     Comment: occasionally      Review of Systems 12 point ROS is negative except for rash, nightsweats, and restless legs    Objective:   Physical Exam BP 123/70  Pulse 72  Temp(Src) 97.8 F (36.6 C) (Oral)  Wt 116 lb (52.617 kg)  BMI 21.93 kg/m2 Physical Exam  Constitutional: oriented to person, place, and time.appears well-developed and well-nourished. No distress.  HENT:  Mouth/Throat: Oropharynx is clear and moist. No oropharyngeal exudate.  Cardiovascular: Normal rate, regular rhythm and normal heart sounds. Exam reveals no gallop and no friction rub.  No murmur heard.  Pulmonary/Chest: Effort normal and breath sounds normal. No respiratory distress. no wheezes.  Abdominal: Soft. Bowel sounds are normal.  exhibits no distension. There is no tenderness.  Lymphadenopathy:  no cervical adenopathy.  Neurological: alert and oriented to person, place, and time.  Skin: Skin is warm and dry. No rash noted. No erythema.  Psychiatric:  a normal mood and affect.  behavior is normal.       Assessment & Plan:  Antibiotics associated diarrhea = since finished abtx, likely resolving in the coming weak  Mild eosinophilia in setting of rash, and low grade fever = could possibly due to antibiotic. Will follow for now.  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

## 2013-01-20 ENCOUNTER — Encounter: Payer: Self-pay | Admitting: Cardiovascular Disease

## 2013-01-20 ENCOUNTER — Ambulatory Visit (INDEPENDENT_AMBULATORY_CARE_PROVIDER_SITE_OTHER): Payer: Medicare Other | Admitting: Cardiovascular Disease

## 2013-01-20 VITALS — BP 122/74 | HR 74 | Ht 61.0 in | Wt 114.4 lb

## 2013-01-20 DIAGNOSIS — R7881 Bacteremia: Secondary | ICD-10-CM

## 2013-01-20 DIAGNOSIS — I059 Rheumatic mitral valve disease, unspecified: Secondary | ICD-10-CM

## 2013-01-20 DIAGNOSIS — A491 Streptococcal infection, unspecified site: Secondary | ICD-10-CM

## 2013-01-20 DIAGNOSIS — R739 Hyperglycemia, unspecified: Secondary | ICD-10-CM

## 2013-01-20 DIAGNOSIS — I339 Acute and subacute endocarditis, unspecified: Secondary | ICD-10-CM | POA: Diagnosis not present

## 2013-01-20 DIAGNOSIS — I341 Nonrheumatic mitral (valve) prolapse: Secondary | ICD-10-CM

## 2013-01-20 DIAGNOSIS — R7309 Other abnormal glucose: Secondary | ICD-10-CM

## 2013-01-20 DIAGNOSIS — B955 Unspecified streptococcus as the cause of diseases classified elsewhere: Secondary | ICD-10-CM

## 2013-01-20 DIAGNOSIS — I39 Endocarditis and heart valve disorders in diseases classified elsewhere: Secondary | ICD-10-CM

## 2013-01-20 NOTE — Assessment & Plan Note (Signed)
No murmur on exam ? Predisposed her to SBE  No vegetation seen but clinical diagnosis consistant.  SBE prophylaxis warranted for her

## 2013-01-20 NOTE — Assessment & Plan Note (Signed)
Possible SBE  No murmur TEE benign and symptoms resolved with course of iv antibiotics  ECG normal with no AV block  F/U echo in 3 months.

## 2013-01-20 NOTE — Progress Notes (Signed)
Patient ID: Lisa Cabrera, female   DOB: [birth year], 67 y.o.   MRN: [id number]   67 yo seen by Dr Vivia Ewing of [facility] September.  Has been ill since dental appt in March.  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 BC positive for strep in early September F/U cultures in late September and October negative   ? Janeway lesions on skin exam She indicates history of murmur.  In hospital echo and TEE with no vegetations  Rx with RUE PIC line with 4 weeks of iv antibiotics. Currently feels well with resolution of fevers and skin lesions No dyspnea cough palpitations or focal ID symptoms  No complications from [facility] line once removed.    Study Conclusions 9/14  - Left ventricle: The cavity size was normal. Wall thickness was normal. Systolic function was normal. The estimated ejection fraction was in the range of 60% to 65%. Wall motion was normal; there were no regional wall motion abnormalities. - Aortic valve: No evidence of vegetation. - Mitral valve: Moderate, late systolicprolapse, involving the anterior leaflet. Moderate regurgitation directed eccentrically and posteriorly. - Left atrium: No evidence of thrombus in the atrial cavity or appendage. No spontaneous echo contrast was observed. - Right atrium: No evidence of thrombus in the atrial cavity or appendage. - Atrial septum: There was no atrial level shunt. - Tricuspid valve: Mild, holosystolicprolapse. Moderate regurgitation directed centrally. - Pulmonic valve: No evidence of vegetation. - Pericardium, extracardiac: A trivial pericardial effusion was identified posterior to the heart. The fluid had no internal echoes.There was no evidence of hemodynamic compromise. Impressions:  - Vegetation is absent.  ROS: Denies fever, malais, weight loss, blurry vision, decreased visual acuity, cough, sputum, SOB, hemoptysis, pleuritic pain, palpitaitons, heartburn, abdominal pain, melena, lower extremity edema, claudication, or rash.  All other  systems reviewed and negative   General: Affect appropriate Healthy:  appears stated age HEENT: normal Neck supple with no adenopathy JVP normal no bruits no thyromegaly Lungs clear with no wheezing and good diaphragmatic motion Heart:  S1/S2 no murmur,rub, gallop or click PMI normal Abdomen: benighn, BS positve, no tenderness, no AAA no bruit.  No HSM or HJR Distal pulses intact with no bruits No edema Neuro non-focal Skin warm and dry No muscular weakness  Medications Current Outpatient Prescriptions  Medication Sig Dispense Refill  . aspirin 81 MG tablet Take 81 mg by mouth daily.      . Cholecalciferol (VITAMIN D) 2000 UNITS CAPS Take by mouth daily.      Marland Kitchen doxepin (SINEQUAN) 50 MG capsule Take 50 mg by mouth at bedtime.      . Multiple Vitamins-Minerals (MULTIVITAMIN PO) Take by mouth daily.      . Omega-3 Fatty Acids (FISH OIL PO) Take by mouth daily.      . verapamil (VERELAN PM) 240 MG 24 hr capsule Take 240 mg by mouth daily.       No current facility-administered medications for this visit.    Allergies Review of patient's allergies indicates no known allergies.  Family History: Family History  Problem Relation Age of Onset  . Dementia Mother   . Heart attack Mother   . Stroke Father   . Kidney disease Father   . Cancer Sister     ovarian    Social History: History   Social History  . Marital Status: Married    Spouse Name: N/A    Number of Children: 1  . Years of Education: college   Occupational History  . retired  Social History Main Topics  . Smoking status: Never Smoker   . Smokeless tobacco: Never Used  . Alcohol Use: Yes     Comment: occasionally  . Drug Use: No  . Sexual Activity: Yes   Other Topics Concern  . Not on file   Social History Narrative   Exercise: walk    Electrocardiogram: NSR nomral ECG rate 76  Assessment and Plan

## 2013-01-20 NOTE — Patient Instructions (Signed)
Your physician recommends that you schedule a follow-up appointment in: 3 MONTHS  WITH  DR Eden Emms ECHO SAME DAY Your physician recommends that you continue on your current medications as directed. Please refer to the Current Medication list given to you today. Your physician has requested that you have an echocardiogram. Echocardiography is a painless test that uses sound waves to create images of your heart. It provides your doctor with information about the size and shape of your heart and how well your heart's chambers and valves are working. This procedure takes approximately one hour. There are no restrictions for this procedure. 3 MONTHS

## 2013-01-20 NOTE — Assessment & Plan Note (Signed)
FU A1c with primary Low carb diet

## 2013-02-03 DIAGNOSIS — A498 Other bacterial infections of unspecified site: Secondary | ICD-10-CM | POA: Diagnosis not present

## 2013-02-03 DIAGNOSIS — D649 Anemia, unspecified: Secondary | ICD-10-CM | POA: Diagnosis not present

## 2013-02-03 DIAGNOSIS — Z452 Encounter for adjustment and management of vascular access device: Secondary | ICD-10-CM | POA: Diagnosis not present

## 2013-02-03 DIAGNOSIS — I330 Acute and subacute infective endocarditis: Secondary | ICD-10-CM | POA: Diagnosis not present

## 2013-02-03 DIAGNOSIS — Z79899 Other long term (current) drug therapy: Secondary | ICD-10-CM | POA: Diagnosis not present

## 2013-02-04 ENCOUNTER — Encounter: Payer: Self-pay | Admitting: Internal Medicine

## 2013-02-04 ENCOUNTER — Other Ambulatory Visit: Payer: Self-pay

## 2013-02-04 ENCOUNTER — Ambulatory Visit (INDEPENDENT_AMBULATORY_CARE_PROVIDER_SITE_OTHER): Payer: Medicare Other | Admitting: Internal Medicine

## 2013-02-04 VITALS — BP 145/65 | HR 74 | Temp 97.6°F | Wt 113.0 lb

## 2013-02-04 DIAGNOSIS — I330 Acute and subacute infective endocarditis: Secondary | ICD-10-CM | POA: Diagnosis not present

## 2013-02-04 DIAGNOSIS — R7612 Nonspecific reaction to cell mediated immunity measurement of gamma interferon antigen response without active tuberculosis: Secondary | ICD-10-CM

## 2013-02-04 NOTE — Progress Notes (Signed)
[facility] NOTE  RFV: follow up for bacteremia/FUO Subjective:    Patient ID: Lisa Cabrera, female    DOB: June 29, 1945, 67 y.o.   MRN: [id number]  HPI 67yo F treated for streptococcal SBE.she feels back to her normal health. Has kept a fever diary this past month. No longer having any fevers. She has increased exercise tolerance. During her evaluation of fuo,  She had discordance of negative TST and positive QTF but no risk factors for mtb.  Current Outpatient Prescriptions on File Prior to Visit  Medication Sig Dispense Refill  . aspirin 81 MG tablet Take 81 mg by mouth daily.      . Cholecalciferol (VITAMIN D) 2000 UNITS CAPS Take by mouth daily.      Marland Kitchen doxepin (SINEQUAN) 50 MG capsule Take 50 mg by mouth at bedtime.      . Multiple Vitamins-Minerals (MULTIVITAMIN PO) Take by mouth daily.      . Omega-3 Fatty Acids (FISH OIL PO) Take by mouth daily.      . verapamil (VERELAN PM) 240 MG 24 hr capsule Take 240 mg by mouth daily.       No current facility-administered medications on file prior to visit.   Active Ambulatory Problems    Diagnosis Date Noted  . Streptococcal bacteremia 12/05/2012  . Normocytic anemia 12/05/2012  . Hyperglycemia 12/05/2012  . 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 Fever of unknown origin 12/03/2012  . Bacteremia 12/04/2012  . Fever 12/04/2012   Past Medical History  Diagnosis Date  . H/O seasonal allergies   . Heart murmur   . Complication of anesthesia   . Family history of anesthesia complication   . Anemia        Review of Systems 12 point ROS is negative    Objective:   Physical Exam BP 145/65  Pulse 74  Temp(Src) 97.6 F (36.4 C) (Oral)  Wt 113 lb (51.256 kg) gen = a xo by 4 in NAD HEENT= NCAT, PERRLA, EOMI, no scleral icterus pulm = ctab, no w/c/r Cors =nl s1,s2, no g/m/r Skin= rash resolved on hands and chest       Assessment & Plan:   Presumably false positive  QTF =  Reviewed findings and discussed that blood test likely false positive especially no RF for active TB  SBE = resolved  rtc prn basis

## 2013-02-17 DIAGNOSIS — H1044 Vernal conjunctivitis: Secondary | ICD-10-CM | POA: Diagnosis not present

## 2013-04-20 DIAGNOSIS — L719 Rosacea, unspecified: Secondary | ICD-10-CM | POA: Diagnosis not present

## 2013-04-20 DIAGNOSIS — L909 Atrophic disorder of skin, unspecified: Secondary | ICD-10-CM | POA: Diagnosis not present

## 2013-04-20 DIAGNOSIS — L570 Actinic keratosis: Secondary | ICD-10-CM | POA: Diagnosis not present

## 2013-04-20 DIAGNOSIS — L821 Other seborrheic keratosis: Secondary | ICD-10-CM | POA: Diagnosis not present

## 2013-04-20 DIAGNOSIS — D239 Other benign neoplasm of skin, unspecified: Secondary | ICD-10-CM | POA: Diagnosis not present

## 2013-04-20 DIAGNOSIS — L819 Disorder of pigmentation, unspecified: Secondary | ICD-10-CM | POA: Diagnosis not present

## 2013-04-20 DIAGNOSIS — D1801 Hemangioma of skin and subcutaneous tissue: Secondary | ICD-10-CM | POA: Diagnosis not present

## 2013-04-28 ENCOUNTER — Encounter: Payer: Self-pay | Admitting: Cardiology

## 2013-04-28 ENCOUNTER — Ambulatory Visit (HOSPITAL_COMMUNITY): Payer: Medicare Other | Attending: Cardiology | Admitting: Cardiology

## 2013-04-28 ENCOUNTER — Encounter: Payer: Self-pay | Admitting: Cardiovascular Disease

## 2013-04-28 ENCOUNTER — Ambulatory Visit (INDEPENDENT_AMBULATORY_CARE_PROVIDER_SITE_OTHER): Payer: Medicare Other | Admitting: Cardiovascular Disease

## 2013-04-28 VITALS — BP 134/80 | HR 70 | Ht 61.0 in | Wt 115.0 lb

## 2013-04-28 DIAGNOSIS — A491 Streptococcal infection, unspecified site: Secondary | ICD-10-CM

## 2013-04-28 DIAGNOSIS — I059 Rheumatic mitral valve disease, unspecified: Secondary | ICD-10-CM

## 2013-04-28 DIAGNOSIS — I341 Nonrheumatic mitral (valve) prolapse: Secondary | ICD-10-CM

## 2013-04-28 DIAGNOSIS — R7881 Bacteremia: Secondary | ICD-10-CM

## 2013-04-28 DIAGNOSIS — I359 Nonrheumatic aortic valve disorder, unspecified: Secondary | ICD-10-CM | POA: Diagnosis not present

## 2013-04-28 DIAGNOSIS — I079 Rheumatic tricuspid valve disease, unspecified: Secondary | ICD-10-CM | POA: Diagnosis not present

## 2013-04-28 DIAGNOSIS — R739 Hyperglycemia, unspecified: Secondary | ICD-10-CM

## 2013-04-28 DIAGNOSIS — I339 Acute and subacute endocarditis, unspecified: Secondary | ICD-10-CM

## 2013-04-28 DIAGNOSIS — B955 Unspecified streptococcus as the cause of diseases classified elsewhere: Secondary | ICD-10-CM

## 2013-04-28 DIAGNOSIS — I39 Endocarditis and heart valve disorders in diseases classified elsewhere: Secondary | ICD-10-CM

## 2013-04-28 DIAGNOSIS — R7309 Other abnormal glucose: Secondary | ICD-10-CM

## 2013-04-28 NOTE — Progress Notes (Signed)
Patient ID: Lisa Cabrera, female   DOB: [birth year], 68 y.o.   MRN: [id number] 68 yo seen by Dr Gean Quint of [facility] September. Has been ill since dental appt in March. 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 BC positive for strep in early September F/U cultures in late September and October negative ? Janeway lesions on skin exam She indicates history of murmur. In hospital echo and TEE with no vegetations Rx with RUE PIC line with 4 weeks of iv antibiotics. Currently feels well with resolution of fevers and skin lesions No dyspnea cough palpitations or focal ID symptoms No complications from [facility] line once removed.  Study Conclusions 9/14  - Left ventricle: The cavity size was normal. Wall thickness was normal. Systolic function was normal. The estimated ejection fraction was in the range of 60% to 65%. Wall motion was normal; there were no regional wall motion abnormalities. - Aortic valve: No evidence of vegetation. - Mitral valve: Moderate, late systolicprolapse, involving the anterior leaflet. Moderate regurgitation directed eccentrically and posteriorly. - Left atrium: No evidence of thrombus in the atrial cavity or appendage. No spontaneous echo contrast was observed. - Right atrium: No evidence of thrombus in the atrial cavity or appendage. - Atrial septum: There was no atrial level shunt. - Tricuspid valve: Mild, holosystolicprolapse. Moderate regurgitation directed centrally. - Pulmonic valve: No evidence of vegetation. - Pericardium, extracardiac: A trivial pericardial effusion was identified posterior to the heart. The fluid had no internal echoes.There was no evidence of hemodynamic compromise. Impressions:  - Vegetation is absent.  Reviewed echo from today and no SBE  Mild MV thickening and MVP with trivial MR      ROS: Denies fever, malais, weight loss, blurry vision, decreased visual acuity, cough, sputum, SOB, hemoptysis, pleuritic pain, palpitaitons, heartburn, abdominal  pain, melena, lower extremity edema, claudication, or rash.  All other systems reviewed and negative  General: Affect appropriate Healthy:  appears stated age 68: normal Neck supple with no adenopathy JVP normal no bruits no thyromegaly Lungs clear with no wheezing and good diaphragmatic motion Heart:  S1/S2 no murmur, no rub, gallop or click PMI normal Abdomen: benighn, BS positve, no tenderness, no AAA no bruit.  No HSM or HJR Distal pulses intact with no bruits No edema Neuro non-focal Skin warm and dry No muscular weakness   Current Outpatient Prescriptions  Medication Sig Dispense Refill  . aspirin 81 MG tablet Take 81 mg by mouth daily.      . Cholecalciferol (VITAMIN D) 2000 UNITS CAPS Take by mouth daily.      Marland Kitchen doxepin (SINEQUAN) 50 MG capsule Take 50 mg by mouth at bedtime.      . metroNIDAZOLE (METROGEL) 1 % gel       . Multiple Vitamins-Minerals (MULTIVITAMIN PO) Take by mouth daily.      . Omega-3 Fatty Acids (FISH OIL PO) Take by mouth daily.      . verapamil (VERELAN PM) 240 MG 24 hr capsule Take 240 mg by mouth daily.       No current facility-administered medications for this visit.    Allergies  Review of patient's allergies indicates no known allergies.  Electrocardiogram:  SR rate 74 normal   Assessment and Plan

## 2013-04-28 NOTE — Assessment & Plan Note (Signed)
Stable by echo with trivial MR and no vegetation Continue SBE given history of strep bacteremia

## 2013-04-28 NOTE — Progress Notes (Signed)
Echo performed. 

## 2013-04-28 NOTE — Assessment & Plan Note (Signed)
Discussed low carb diet.  Target hemoglobin A1c is 6.5 or less.  Continue current medications.  

## 2013-04-28 NOTE — Patient Instructions (Signed)
Your physician wants you to follow-up in:  [place]  ECHO  SAME DAY  You will receive a reminder letter in the mail two months in advance. If you don't receive a letter, please call our office to schedule the follow-up appointment. Your physician recommends that you continue on your current medications as directed. Please refer to the Current Medication list given to you today. Your physician has requested that you have an echocardiogram. Echocardiography is a painless test that uses sound waves to create images of your heart. It provides your doctor with information about the size and shape of your heart and how well your heart's chambers and valves are working. This procedure takes approximately one hour. There are no restrictions for this procedure.

## 2013-04-28 NOTE — Assessment & Plan Note (Signed)
Has only had one temp of 99 degrees for a day Rx with tylenol  No other incidence of systemic infection Off antibiotics  F/U echo in 6 months

## 2013-08-02 DIAGNOSIS — M899 Disorder of bone, unspecified: Secondary | ICD-10-CM | POA: Diagnosis not present

## 2013-08-02 DIAGNOSIS — M949 Disorder of cartilage, unspecified: Secondary | ICD-10-CM | POA: Diagnosis not present

## 2013-08-02 DIAGNOSIS — Z0000 Encounter for general adult medical examination without abnormal findings: Secondary | ICD-10-CM | POA: Diagnosis not present

## 2013-08-02 DIAGNOSIS — F430 Acute stress reaction: Secondary | ICD-10-CM | POA: Diagnosis not present

## 2013-08-02 DIAGNOSIS — E7800 Pure hypercholesterolemia, unspecified: Secondary | ICD-10-CM | POA: Diagnosis not present

## 2013-08-02 DIAGNOSIS — G43109 Migraine with aura, not intractable, without status migrainosus: Secondary | ICD-10-CM | POA: Diagnosis not present

## 2013-08-02 DIAGNOSIS — Z79899 Other long term (current) drug therapy: Secondary | ICD-10-CM | POA: Diagnosis not present

## 2013-08-02 DIAGNOSIS — Z23 Encounter for immunization: Secondary | ICD-10-CM | POA: Diagnosis not present

## 2013-10-04 DIAGNOSIS — Z1231 Encounter for screening mammogram for malignant neoplasm of breast: Secondary | ICD-10-CM | POA: Diagnosis not present

## 2013-11-09 DIAGNOSIS — M899 Disorder of bone, unspecified: Secondary | ICD-10-CM | POA: Diagnosis not present

## 2013-11-09 DIAGNOSIS — I872 Venous insufficiency (chronic) (peripheral): Secondary | ICD-10-CM | POA: Diagnosis not present

## 2013-11-09 DIAGNOSIS — Z124 Encounter for screening for malignant neoplasm of cervix: Secondary | ICD-10-CM | POA: Diagnosis not present

## 2013-11-09 DIAGNOSIS — N952 Postmenopausal atrophic vaginitis: Secondary | ICD-10-CM | POA: Diagnosis not present

## 2013-11-24 ENCOUNTER — Ambulatory Visit (INDEPENDENT_AMBULATORY_CARE_PROVIDER_SITE_OTHER): Payer: Medicare Other | Admitting: Cardiovascular Disease

## 2013-11-24 ENCOUNTER — Ambulatory Visit (HOSPITAL_COMMUNITY): Payer: Medicare Other | Attending: Internal Medicine | Admitting: Radiology

## 2013-11-24 ENCOUNTER — Other Ambulatory Visit (HOSPITAL_COMMUNITY): Payer: Self-pay | Admitting: Cardiovascular Disease

## 2013-11-24 VITALS — BP 128/62 | HR 64 | Ht 61.0 in | Wt 119.4 lb

## 2013-11-24 DIAGNOSIS — R7309 Other abnormal glucose: Secondary | ICD-10-CM | POA: Diagnosis not present

## 2013-11-24 DIAGNOSIS — R739 Hyperglycemia, unspecified: Secondary | ICD-10-CM

## 2013-11-24 DIAGNOSIS — I519 Heart disease, unspecified: Secondary | ICD-10-CM | POA: Diagnosis not present

## 2013-11-24 DIAGNOSIS — I341 Nonrheumatic mitral (valve) prolapse: Secondary | ICD-10-CM

## 2013-11-24 DIAGNOSIS — I059 Rheumatic mitral valve disease, unspecified: Secondary | ICD-10-CM | POA: Insufficient documentation

## 2013-11-24 DIAGNOSIS — I330 Acute and subacute infective endocarditis: Secondary | ICD-10-CM

## 2013-11-24 NOTE — Progress Notes (Signed)
Patient ID: Lisa Cabrera, female   DOB: 11-25-1945, 68 y.o.   MRN: [id number] 68 yo seen by Dr Gean Quint of [facility] September. Has been ill since dental appt in March. 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 BC positive for strep in early September F/U cultures in late September and October negative ? Janeway lesions on skin exam She indicates history of murmur. In hospital echo and TEE with no vegetations Rx with RUE PIC line with 4 weeks of iv antibiotics. Currently feels well with resolution of fevers and skin lesions No dyspnea cough palpitations or focal ID symptoms No complications from [facility] line once removed.  Study Conclusions 9/14  - Left ventricle: The cavity size was normal. Wall thickness was normal. Systolic function was normal. The estimated ejection fraction was in the range of 60% to 65%. Wall motion was normal; there were no regional wall motion abnormalities. - Aortic valve: No evidence of vegetation. - Mitral valve: Moderate, late systolicprolapse, involving the anterior leaflet. Moderate regurgitation directed eccentrically and posteriorly. - Left atrium: No evidence of thrombus in the atrial cavity or appendage. No spontaneous echo contrast was observed. - Right atrium: No evidence of thrombus in the atrial cavity or appendage. - Atrial septum: There was no atrial level shunt. - Tricuspid valve: Mild, holosystolicprolapse. Moderate regurgitation directed centrally. - Pulmonic valve: No evidence of vegetation. - Pericardium, extracardiac: A trivial pericardial effusion was identified posterior to the heart. The fluid had no internal echoes.There was no evidence of hemodynamic compromise. Impressions:  - Vegetation is absent.  Reviewed echo from today and no SBE mild to moderate MR no ideally visualized  Myxomatous valve with no vegetation  EF normal no CE     ROS: Denies fever, malais, weight loss, blurry vision, decreased visual acuity, cough, sputum, SOB, hemoptysis,  pleuritic pain, palpitaitons, heartburn, abdominal pain, melena, lower extremity edema, claudication, or rash.  All other systems reviewed and negative  General: Affect appropriate Healthy:  appears stated age 44: normal Neck supple with no adenopathy JVP normal no bruits no thyromegaly Lungs clear with no wheezing and good diaphragmatic motion Heart:  S1/S2 mid peaking MR  murmur, no rub, gallop or click PMI normal Abdomen: benighn, BS positve, no tenderness, no AAA no bruit.  No HSM or HJR Distal pulses intact with no bruits No edema Neuro non-focal Skin warm and dry No muscular weakness   Current Outpatient Prescriptions  Medication Sig Dispense Refill  . aspirin 81 MG tablet Take 81 mg by mouth daily.      . Cholecalciferol (VITAMIN D) 2000 UNITS CAPS Take by mouth daily.      Marland Kitchen doxepin (SINEQUAN) 50 MG capsule Take 50 mg by mouth at bedtime.      . metroNIDAZOLE (METROGEL) 1 % gel       . Multiple Vitamins-Minerals (MULTIVITAMIN PO) Take by mouth daily.      . Omega-3 Fatty Acids (FISH OIL PO) Take by mouth daily.      . verapamil (VERELAN PM) 240 MG 24 hr capsule Take 240 mg by mouth daily.       No current facility-administered medications for this visit.    Allergies  Review of patient's allergies indicates no known allergies.  Electrocardiogram:  SR rate 74 normal   Assessment and Plan

## 2013-11-24 NOTE — Assessment & Plan Note (Signed)
With previous SBE  Normal LV size and function  Mild to moderate residual MR Asymptomatic  SBE prophylaxis with previous infection F/U echo in a year or if new symptoms

## 2013-11-24 NOTE — Progress Notes (Signed)
Echocardiogram performed.  

## 2013-11-24 NOTE — Assessment & Plan Note (Signed)
Discussed low carb diet.  Target hemoglobin A1c is 6.5 or less.  Continue current medications.  

## 2013-11-24 NOTE — Patient Instructions (Addendum)
Your physician wants you to follow-up in:   12MONTHS WITH  DR NISHAN You will receive a reminder letter in the mail two months in advance. If you don't receive a letter, please call our office to schedule the follow-up appointment. Your physician recommends that you continue on your current medications as directed. Please refer to the Current Medication list given to you today.  

## 2014-01-14 ENCOUNTER — Ambulatory Visit (INDEPENDENT_AMBULATORY_CARE_PROVIDER_SITE_OTHER): Payer: Medicare Other | Admitting: *Deleted

## 2014-01-14 DIAGNOSIS — Z23 Encounter for immunization: Secondary | ICD-10-CM | POA: Diagnosis not present

## 2014-02-08 DIAGNOSIS — H1013 Acute atopic conjunctivitis, bilateral: Secondary | ICD-10-CM | POA: Diagnosis not present

## 2014-04-25 DIAGNOSIS — R05 Cough: Secondary | ICD-10-CM | POA: Diagnosis not present

## 2014-05-05 DIAGNOSIS — R05 Cough: Secondary | ICD-10-CM | POA: Diagnosis not present

## 2014-05-24 DIAGNOSIS — D1800 Hemangioma unspecified site: Secondary | ICD-10-CM | POA: Diagnosis not present

## 2014-05-24 DIAGNOSIS — L719 Rosacea, unspecified: Secondary | ICD-10-CM | POA: Diagnosis not present

## 2014-05-24 DIAGNOSIS — L821 Other seborrheic keratosis: Secondary | ICD-10-CM | POA: Diagnosis not present

## 2014-05-24 DIAGNOSIS — L814 Other melanin hyperpigmentation: Secondary | ICD-10-CM | POA: Diagnosis not present

## 2014-08-17 DIAGNOSIS — E780 Pure hypercholesterolemia: Secondary | ICD-10-CM | POA: Diagnosis not present

## 2014-08-17 DIAGNOSIS — F430 Acute stress reaction: Secondary | ICD-10-CM | POA: Diagnosis not present

## 2014-08-17 DIAGNOSIS — G43909 Migraine, unspecified, not intractable, without status migrainosus: Secondary | ICD-10-CM | POA: Diagnosis not present

## 2014-08-17 DIAGNOSIS — Z131 Encounter for screening for diabetes mellitus: Secondary | ICD-10-CM | POA: Diagnosis not present

## 2014-08-17 DIAGNOSIS — Z0000 Encounter for general adult medical examination without abnormal findings: Secondary | ICD-10-CM | POA: Diagnosis not present

## 2014-09-26 ENCOUNTER — Other Ambulatory Visit: Payer: Self-pay

## 2014-11-22 IMAGING — CR DG CHEST 2V
2 series · 2 of 2 positions shown · non-contrast
Comparison: None.

CLINICAL DATA: Cough and fever.

CHEST - 2 VIEW

[PA]
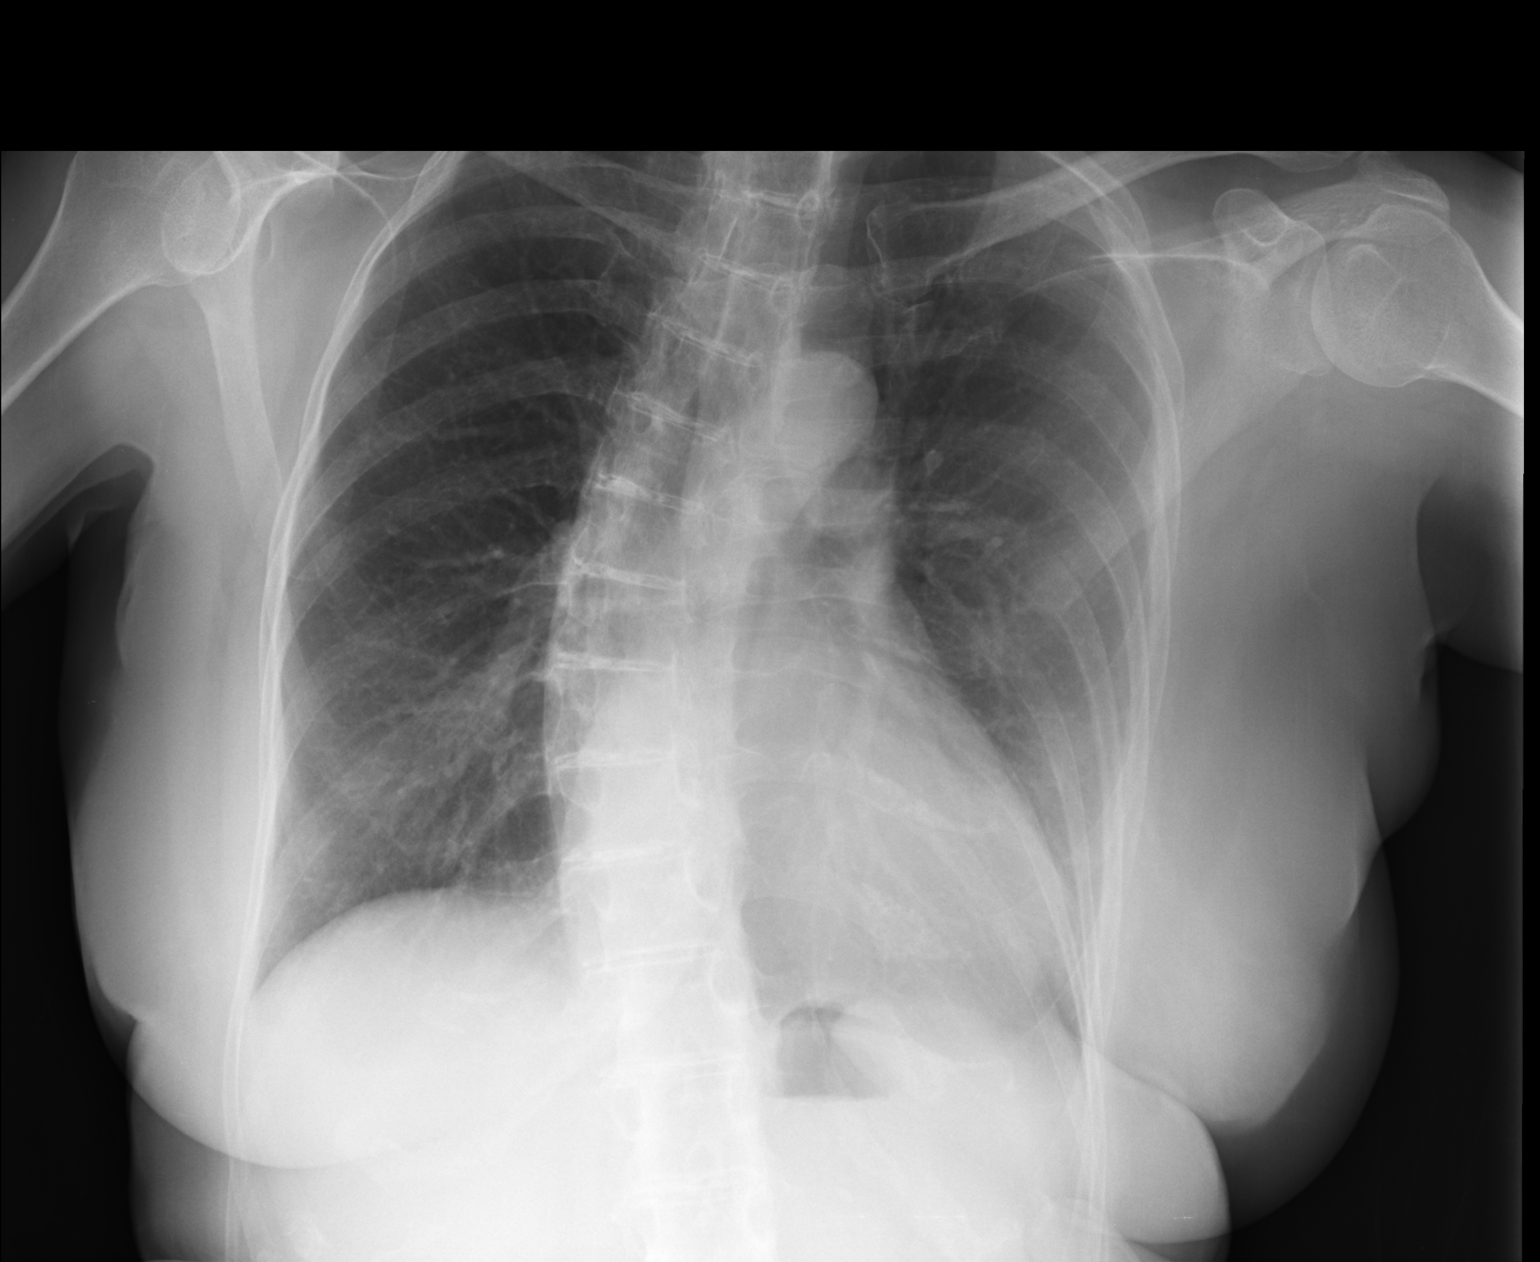

[lateral]
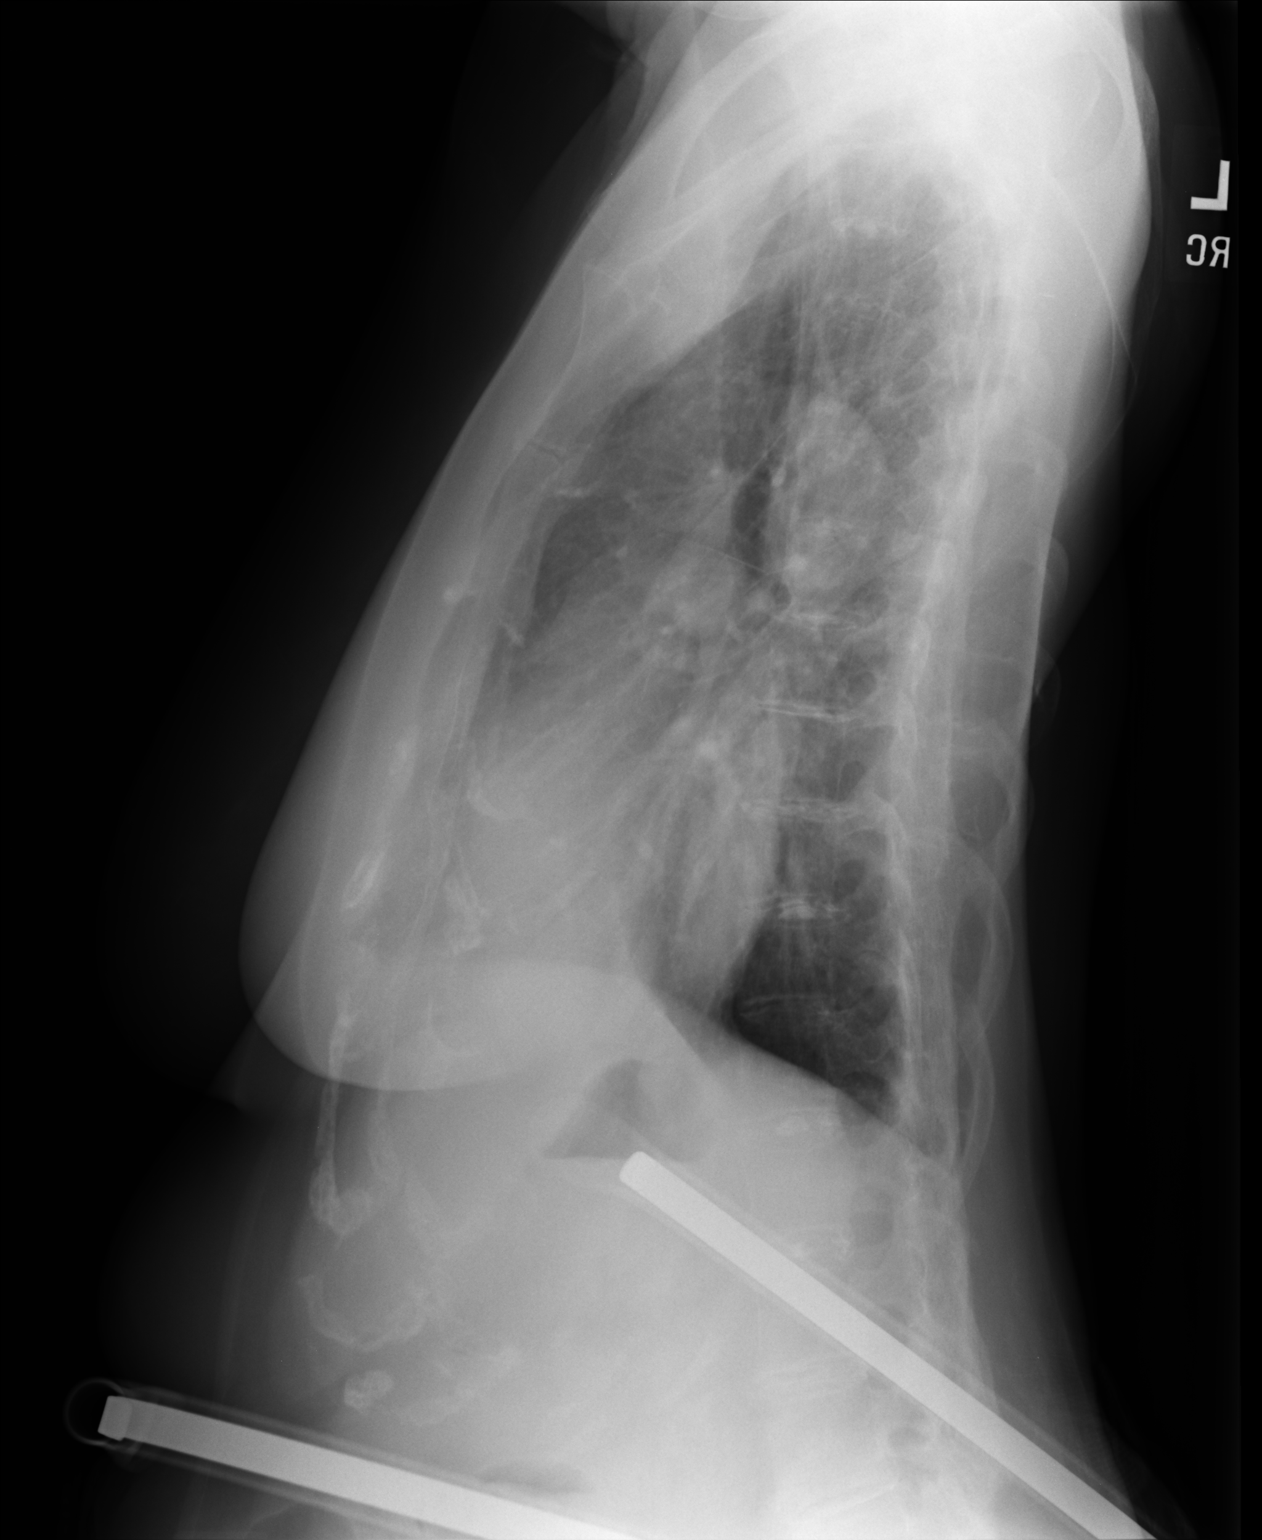

[2 of 2 positions shown; findings below may reference images not displayed]

FINDINGS: Lungs are clear.  Heart size is normal.  No pneumothorax
or pleural fluid.  Convex right thoracic scoliosis is noted.
IMPRESSION: No acute finding.

Clinically significant discrepancy from primary report, if
provided: None

## 2014-11-24 ENCOUNTER — Other Ambulatory Visit: Payer: Self-pay

## 2014-11-24 ENCOUNTER — Other Ambulatory Visit: Payer: Self-pay | Admitting: Cardiovascular Disease

## 2014-11-24 ENCOUNTER — Ambulatory Visit (HOSPITAL_COMMUNITY): Payer: Medicare Other | Attending: Cardiology

## 2014-11-24 DIAGNOSIS — I340 Nonrheumatic mitral (valve) insufficiency: Secondary | ICD-10-CM | POA: Insufficient documentation

## 2014-11-24 DIAGNOSIS — I341 Nonrheumatic mitral (valve) prolapse: Secondary | ICD-10-CM | POA: Insufficient documentation

## 2014-11-24 DIAGNOSIS — I517 Cardiomegaly: Secondary | ICD-10-CM | POA: Insufficient documentation

## 2014-11-29 NOTE — Progress Notes (Signed)
Patient ID: Lisa Cabrera, female   DOB: [birth year], 69 y.o.   MRN: [id number] 69 y.o.  seen by Dr Gean Quint of [facility] September 2014 . Had been ill since dental appt in March. 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 BC positive for strep in early September F/U cultures in late September and October negative ? Janeway lesions on skin exam She indicates history of murmur. In hospital echo and TEE with no vegetations Rx with RUE PIC line with 4 weeks of iv antibiotics. Currently feels well with resolution of fevers and skin lesions No dyspnea cough palpitations or focal ID symptoms No complications from [facility] line once removed.   Study Conclusions Sept 2014   - Left ventricle: The cavity size was normal. Wall thickness was normal. Systolic function was normal. The estimated ejection fraction was in the range of 60% to 65%. Wall motion was normal; there were no regional wall motion abnormalities. - Aortic valve: No evidence of vegetation. - Mitral valve: Moderate, late systolicprolapse, involving the anterior leaflet. Moderate regurgitation directed eccentrically and posteriorly. - Left atrium: No evidence of thrombus in the atrial cavity or appendage. No spontaneous echo contrast was observed. - Right atrium: No evidence of thrombus in the atrial cavity or appendage. - Atrial septum: There was no atrial level shunt. - Tricuspid valve: Mild, holosystolicprolapse. Moderate regurgitation directed centrally. - Pulmonic valve: No evidence of vegetation. - Pericardium, extracardiac: A trivial pericardial effusion was identified posterior to the heart. The fluid had no internal echoes.There was no evidence of hemodynamic compromise. Impressions:  - Vegetation is absent.  Echo from 11/24/14 also reviewed and moderate MR with prolapse  Study Conclusions  - Left ventricle: The cavity size was normal. Wall thickness was normal. Systolic function was normal. The estimated ejection fraction was in  the range of 55% to 60%. Wall motion was normal; there were no regional wall motion abnormalities. Doppler parameters are consistent with abnormal left ventricular relaxation (grade 1 diastolic dysfunction). - Mitral valve: Moderate prolapse, involving the anterior leaflet. There was moderate regurgitation directed eccentrically and posteriorly. - Left atrium: The atrium was moderately dilated.  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    ROS: Denies fever, malais, weight loss, blurry vision, decreased visual acuity, cough, sputum, SOB, hemoptysis, pleuritic pain, palpitaitons, heartburn, abdominal pain, melena, lower extremity edema, claudication, or rash.  All other systems reviewed and negative  General: Affect appropriate Healthy:  appears stated age 37: normal Neck supple with no adenopathy JVP normal no bruits no thyromegaly Lungs clear with no wheezing and good diaphragmatic motion Heart:  S1/S2 mid peaking MR  murmur, no rub, gallop or click PMI normal Abdomen: benighn, BS positve, no tenderness, no AAA no bruit.  No HSM or HJR Distal pulses intact with no bruits No edema Neuro non-focal Skin warm and dry No muscular weakness   Current Outpatient Prescriptions  Medication Sig Dispense Refill  . aspirin 81 MG tablet Take 81 mg by mouth daily.    . Cholecalciferol (VITAMIN D) 2000 UNITS CAPS Take by mouth daily.    Marland Kitchen doxepin (SINEQUAN) 50 MG capsule Take 50 mg by mouth at bedtime.    . metroNIDAZOLE (METROGEL) 1 % gel Apply topically daily. ROSACEA    . Multiple Vitamins-Minerals (MULTIVITAMIN PO) Take by mouth daily.    . Omega-3 Fatty Acids (FISH  OIL PO) Take by mouth daily.    . verapamil (VERELAN PM) 240 MG 24 hr capsule Take 240 mg by mouth  daily.     No current facility-administered medications for this visit.    Allergies  Review of patient's allergies indicates no known allergies.  Electrocardiogram:  SR rate 74 normal  11/30/14  SR rate 66 normal possible LAE   Assessment and Plan HTN:  Well controlled.  Continue current medications and low sodium Dash type diet.   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

## 2014-11-30 ENCOUNTER — Encounter: Payer: Self-pay | Admitting: Cardiovascular Disease

## 2014-11-30 ENCOUNTER — Ambulatory Visit (INDEPENDENT_AMBULATORY_CARE_PROVIDER_SITE_OTHER): Payer: Medicare Other | Admitting: Cardiovascular Disease

## 2014-11-30 VITALS — BP 114/60 | HR 66 | Ht 61.5 in | Wt 123.8 lb

## 2014-11-30 DIAGNOSIS — I341 Nonrheumatic mitral (valve) prolapse: Secondary | ICD-10-CM | POA: Diagnosis not present

## 2014-11-30 DIAGNOSIS — R739 Hyperglycemia, unspecified: Secondary | ICD-10-CM | POA: Diagnosis not present

## 2014-11-30 NOTE — Patient Instructions (Signed)
Medication Instructions:  Your physician recommends that you continue on your current medications as directed. Please refer to the Current Medication list given to you today.   Labwork: NONE  Testing/Procedures: Your physician has requested that you have an echocardiogram. Echocardiography is a painless test that uses sound waves to create images of your heart. It provides your doctor with information about the size and shape of your heart and how well your heart's chambers and valves are working. This procedure takes approximately one hour. There are no restrictions for this procedure.  IN 1 YEAR     Follow-Up: Your physician wants you to follow-up in: [place]  ECHO [place] will receive a reminder letter in the mail two months in advance. If you don't receive a letter, please call our office to schedule the follow-up appointment.   Any Other Special Instructions Will Be Listed Below (If Applicable).

## 2014-12-19 DIAGNOSIS — R05 Cough: Secondary | ICD-10-CM | POA: Diagnosis not present

## 2014-12-31 IMAGING — CR DG CHEST 2V
2 series · 2 of 2 positions shown · non-contrast
Comparison: 10/[birth year]/1938

CLINICAL DATA: Cough, fever

CHEST - 2 VIEW

[PA]
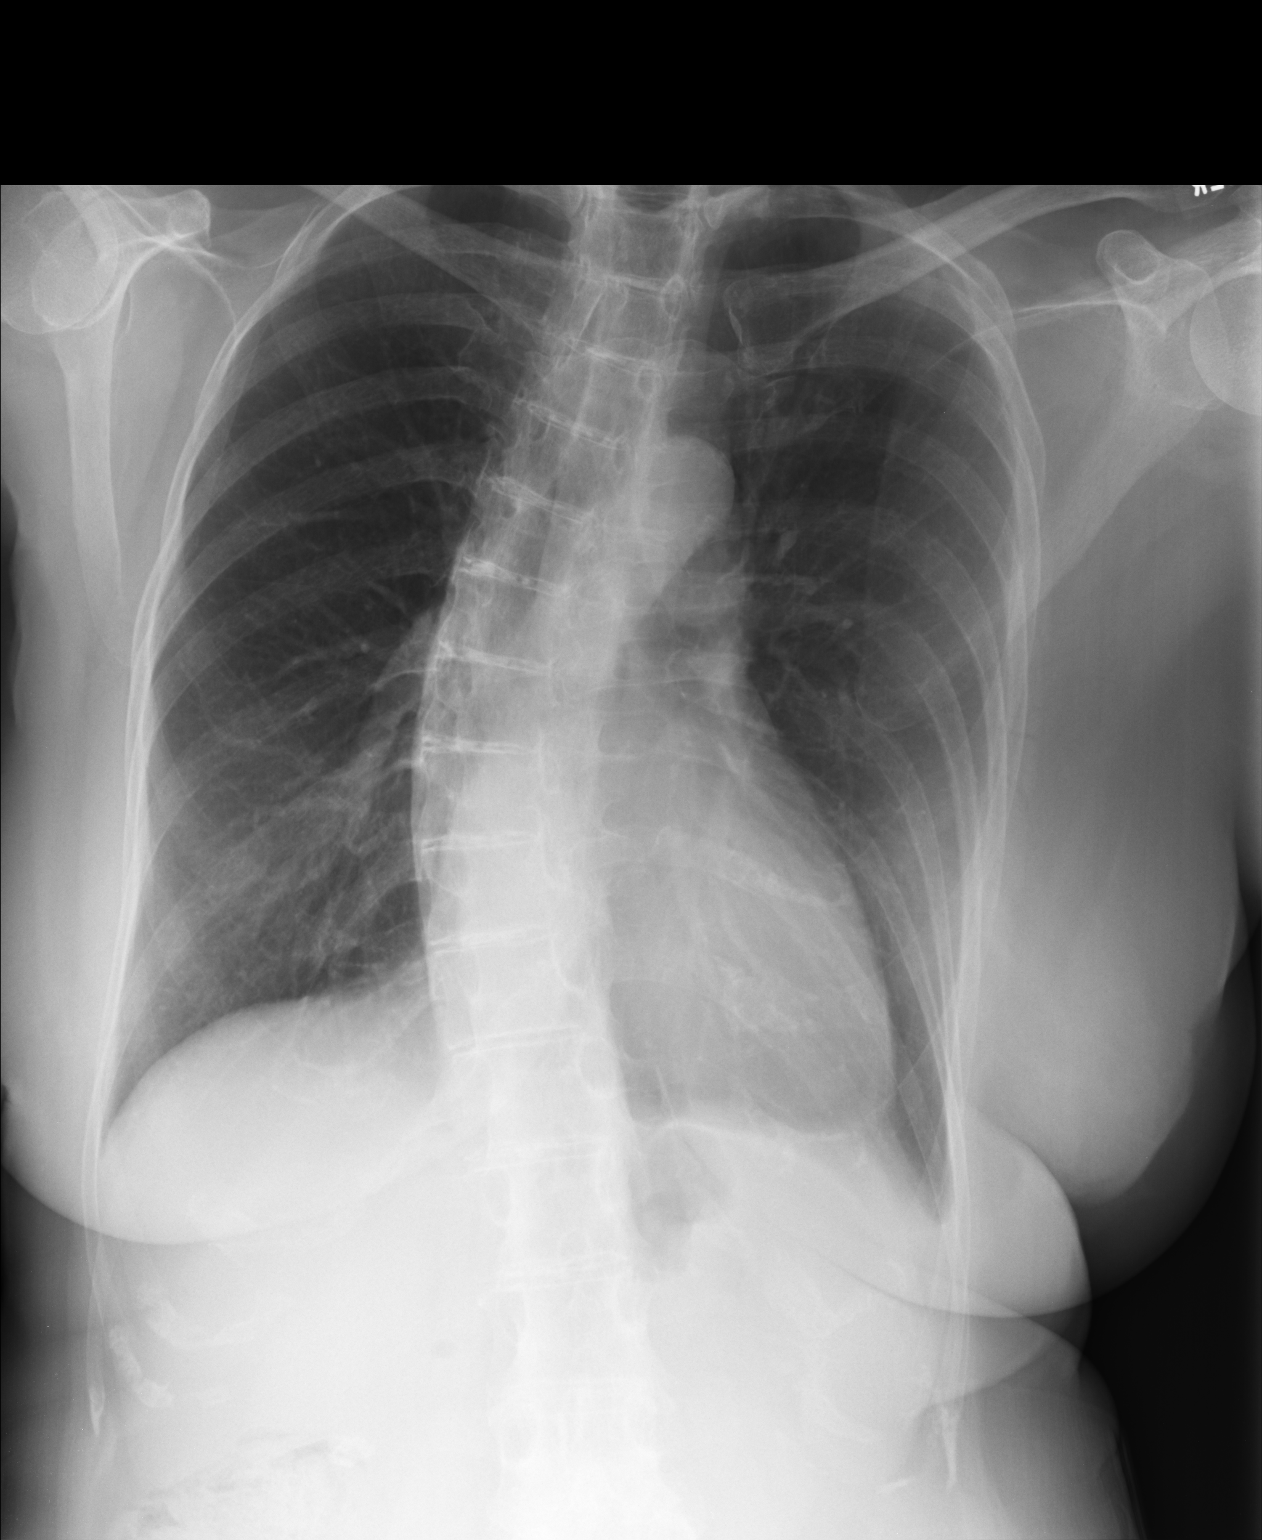

[lateral]
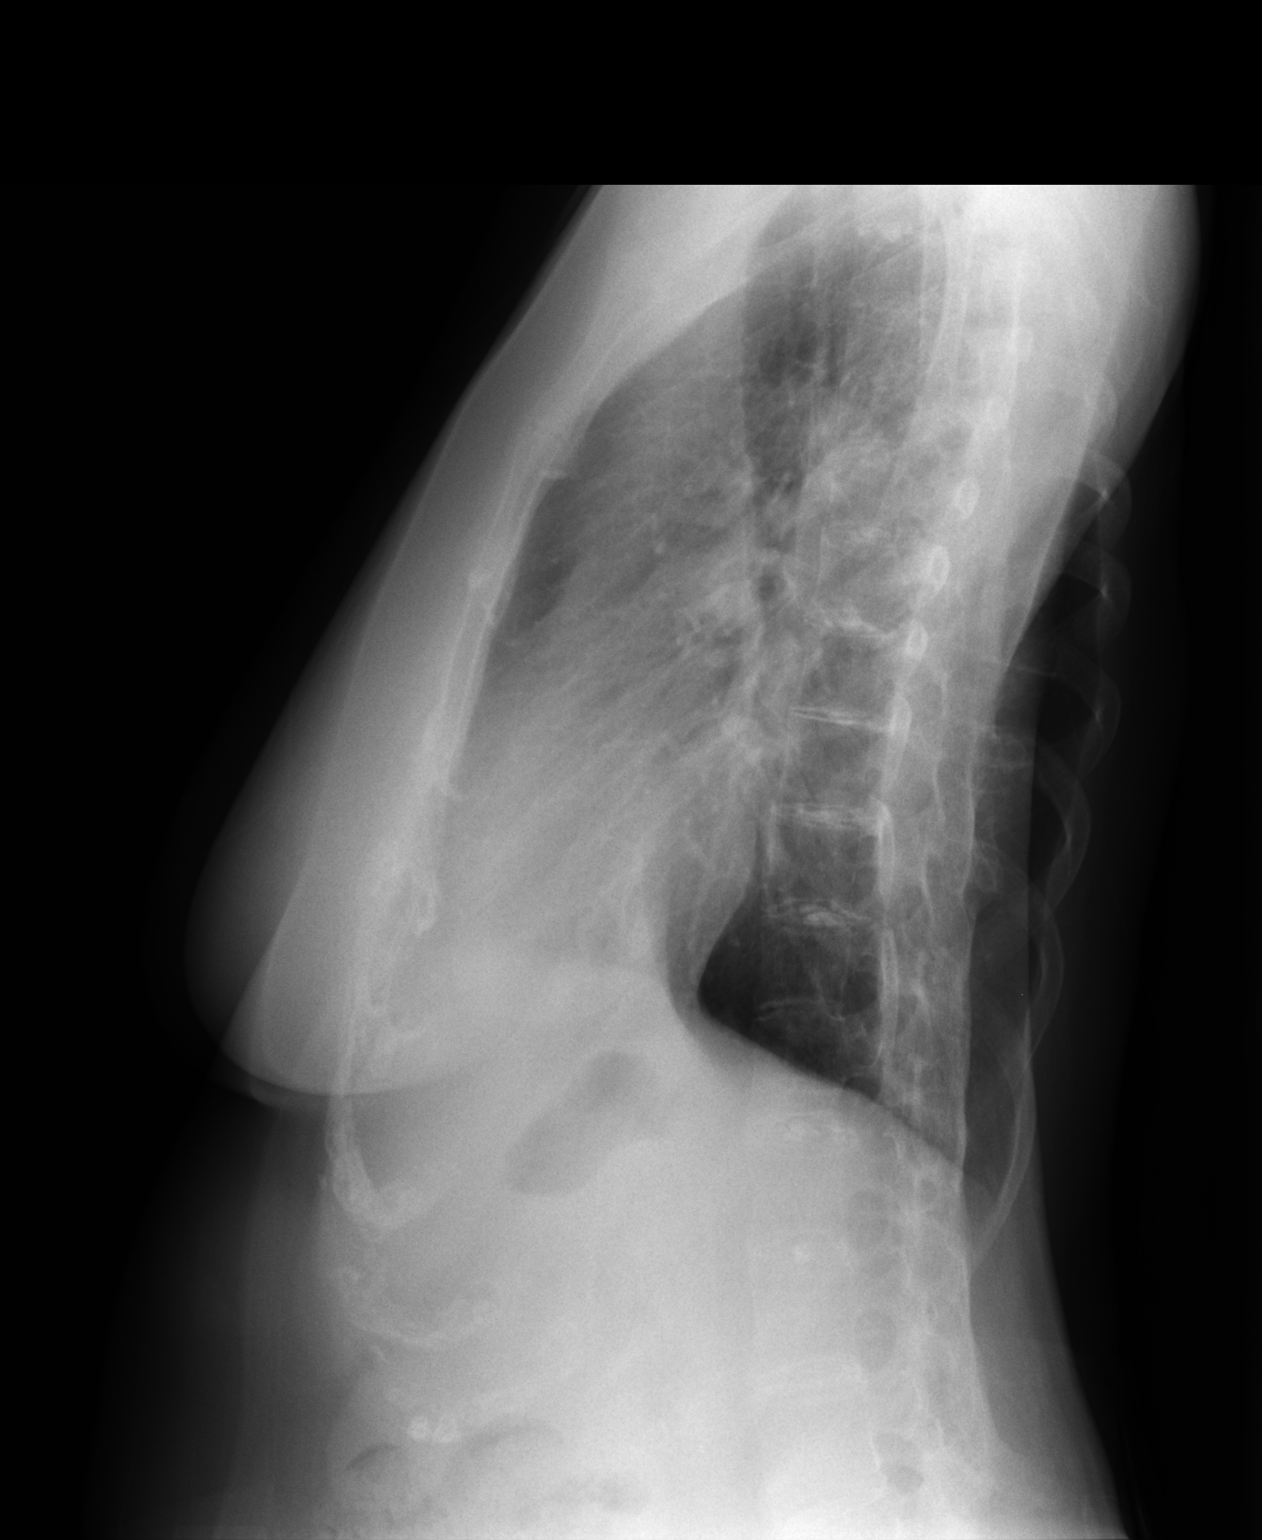

[2 of 2 positions shown; findings below may reference images not displayed]

FINDINGS: Cardiomediastinal silhouette is stable.  Hyperinflation
again noted.  Stable thoracic dextroscoliosis.  No acute infiltrate
or pulmonary edema.
IMPRESSION: Hyperinflation.  No active disease.  Thoracic
dextroscoliosis again noted.

Clinically significant discrepancy from primary report, if
provided: None

## 2015-01-08 DIAGNOSIS — N390 Urinary tract infection, site not specified: Secondary | ICD-10-CM | POA: Diagnosis not present

## 2015-01-17 DIAGNOSIS — Z23 Encounter for immunization: Secondary | ICD-10-CM | POA: Diagnosis not present

## 2015-01-27 DIAGNOSIS — M8580 Other specified disorders of bone density and structure, unspecified site: Secondary | ICD-10-CM | POA: Diagnosis not present

## 2015-01-27 DIAGNOSIS — Z1231 Encounter for screening mammogram for malignant neoplasm of breast: Secondary | ICD-10-CM | POA: Diagnosis not present

## 2015-01-27 DIAGNOSIS — M8588 Other specified disorders of bone density and structure, other site: Secondary | ICD-10-CM | POA: Diagnosis not present

## 2015-02-21 DIAGNOSIS — M8580 Other specified disorders of bone density and structure, unspecified site: Secondary | ICD-10-CM | POA: Diagnosis not present

## 2015-03-27 DIAGNOSIS — S3992XA Unspecified injury of lower back, initial encounter: Secondary | ICD-10-CM | POA: Diagnosis not present

## 2015-03-27 DIAGNOSIS — M5489 Other dorsalgia: Secondary | ICD-10-CM | POA: Diagnosis not present

## 2015-03-27 DIAGNOSIS — S32110A Nondisplaced Zone I fracture of sacrum, initial encounter for closed fracture: Secondary | ICD-10-CM | POA: Diagnosis not present

## 2015-03-27 DIAGNOSIS — S32018A Other fracture of first lumbar vertebra, initial encounter for closed fracture: Secondary | ICD-10-CM | POA: Diagnosis not present

## 2015-03-27 DIAGNOSIS — M545 Low back pain: Secondary | ICD-10-CM | POA: Diagnosis not present

## 2015-03-30 DIAGNOSIS — S22009D Unspecified fracture of unspecified thoracic vertebra, subsequent encounter for fracture with routine healing: Secondary | ICD-10-CM | POA: Diagnosis not present

## 2015-04-07 DIAGNOSIS — M546 Pain in thoracic spine: Secondary | ICD-10-CM | POA: Diagnosis not present

## 2015-04-07 DIAGNOSIS — I10 Essential (primary) hypertension: Secondary | ICD-10-CM | POA: Diagnosis not present

## 2015-04-07 DIAGNOSIS — M542 Cervicalgia: Secondary | ICD-10-CM | POA: Diagnosis not present

## 2015-04-07 DIAGNOSIS — S32018A Other fracture of first lumbar vertebra, initial encounter for closed fracture: Secondary | ICD-10-CM | POA: Diagnosis not present

## 2015-04-12 ENCOUNTER — Other Ambulatory Visit: Payer: Self-pay | Admitting: Neurosurgery

## 2015-04-12 DIAGNOSIS — M546 Pain in thoracic spine: Secondary | ICD-10-CM

## 2015-04-12 DIAGNOSIS — M542 Cervicalgia: Secondary | ICD-10-CM

## 2015-04-14 ENCOUNTER — Ambulatory Visit
Admission: RE | Admit: 2015-04-14 | Discharge: 2015-04-14 | Disposition: A | Discharge status: Home / Self Care | Payer: Medicare Other | Source: Ambulatory Visit | Attending: Neurosurgery | Admitting: Neurosurgery

## 2015-04-14 ENCOUNTER — Other Ambulatory Visit: Payer: Self-pay | Admitting: Neurosurgery

## 2015-04-14 DIAGNOSIS — S32018A Other fracture of first lumbar vertebra, initial encounter for closed fracture: Secondary | ICD-10-CM

## 2015-04-14 DIAGNOSIS — M546 Pain in thoracic spine: Secondary | ICD-10-CM | POA: Diagnosis not present

## 2015-04-16 ENCOUNTER — Ambulatory Visit
Admission: RE | Admit: 2015-04-16 | Discharge: 2015-04-16 | Disposition: A | Discharge status: Home / Self Care | Payer: Medicare Other | Source: Ambulatory Visit | Attending: Neurosurgery | Admitting: Neurosurgery

## 2015-04-16 ENCOUNTER — Other Ambulatory Visit: Payer: Self-pay | Admitting: Neurosurgery

## 2015-04-16 DIAGNOSIS — S22030A Wedge compression fracture of third thoracic vertebra, initial encounter for closed fracture: Secondary | ICD-10-CM | POA: Diagnosis not present

## 2015-04-16 DIAGNOSIS — M542 Cervicalgia: Secondary | ICD-10-CM | POA: Diagnosis not present

## 2015-04-16 DIAGNOSIS — M546 Pain in thoracic spine: Secondary | ICD-10-CM

## 2015-04-18 DIAGNOSIS — I10 Essential (primary) hypertension: Secondary | ICD-10-CM | POA: Diagnosis not present

## 2015-04-18 DIAGNOSIS — S22030A Wedge compression fracture of third thoracic vertebra, initial encounter for closed fracture: Secondary | ICD-10-CM | POA: Diagnosis not present

## 2015-04-23 DIAGNOSIS — B029 Zoster without complications: Secondary | ICD-10-CM | POA: Diagnosis not present

## 2015-05-05 DIAGNOSIS — S22009D Unspecified fracture of unspecified thoracic vertebra, subsequent encounter for fracture with routine healing: Secondary | ICD-10-CM | POA: Diagnosis not present

## 2015-05-05 DIAGNOSIS — B029 Zoster without complications: Secondary | ICD-10-CM | POA: Diagnosis not present

## 2015-05-30 DIAGNOSIS — L821 Other seborrheic keratosis: Secondary | ICD-10-CM | POA: Diagnosis not present

## 2015-05-30 DIAGNOSIS — D225 Melanocytic nevi of trunk: Secondary | ICD-10-CM | POA: Diagnosis not present

## 2015-05-30 DIAGNOSIS — L814 Other melanin hyperpigmentation: Secondary | ICD-10-CM | POA: Diagnosis not present

## 2015-05-30 DIAGNOSIS — Z23 Encounter for immunization: Secondary | ICD-10-CM | POA: Diagnosis not present

## 2015-05-30 DIAGNOSIS — B029 Zoster without complications: Secondary | ICD-10-CM | POA: Diagnosis not present

## 2015-05-30 DIAGNOSIS — D1800 Hemangioma unspecified site: Secondary | ICD-10-CM | POA: Diagnosis not present

## 2015-06-13 DIAGNOSIS — S32018D Other fracture of first lumbar vertebra, subsequent encounter for fracture with routine healing: Secondary | ICD-10-CM | POA: Diagnosis not present

## 2015-06-13 DIAGNOSIS — S22030A Wedge compression fracture of third thoracic vertebra, initial encounter for closed fracture: Secondary | ICD-10-CM | POA: Diagnosis not present

## 2015-10-10 DIAGNOSIS — G43909 Migraine, unspecified, not intractable, without status migrainosus: Secondary | ICD-10-CM | POA: Diagnosis not present

## 2015-10-10 DIAGNOSIS — Z0000 Encounter for general adult medical examination without abnormal findings: Secondary | ICD-10-CM | POA: Diagnosis not present

## 2015-10-10 DIAGNOSIS — Z131 Encounter for screening for diabetes mellitus: Secondary | ICD-10-CM | POA: Diagnosis not present

## 2015-10-10 DIAGNOSIS — K589 Irritable bowel syndrome without diarrhea: Secondary | ICD-10-CM | POA: Diagnosis not present

## 2015-10-10 DIAGNOSIS — F430 Acute stress reaction: Secondary | ICD-10-CM | POA: Diagnosis not present

## 2015-10-10 DIAGNOSIS — E7800 Pure hypercholesterolemia, unspecified: Secondary | ICD-10-CM | POA: Diagnosis not present

## 2015-10-11 DIAGNOSIS — Z131 Encounter for screening for diabetes mellitus: Secondary | ICD-10-CM | POA: Diagnosis not present

## 2015-10-11 DIAGNOSIS — Z0000 Encounter for general adult medical examination without abnormal findings: Secondary | ICD-10-CM | POA: Diagnosis not present

## 2015-10-11 DIAGNOSIS — E7800 Pure hypercholesterolemia, unspecified: Secondary | ICD-10-CM | POA: Diagnosis not present

## 2015-10-11 DIAGNOSIS — H2513 Age-related nuclear cataract, bilateral: Secondary | ICD-10-CM | POA: Diagnosis not present

## 2015-10-11 DIAGNOSIS — K589 Irritable bowel syndrome without diarrhea: Secondary | ICD-10-CM | POA: Diagnosis not present

## 2015-10-11 DIAGNOSIS — G43909 Migraine, unspecified, not intractable, without status migrainosus: Secondary | ICD-10-CM | POA: Diagnosis not present

## 2015-10-16 ENCOUNTER — Encounter: Payer: Self-pay | Admitting: Cardiovascular Disease

## 2015-11-23 ENCOUNTER — Other Ambulatory Visit: Payer: Self-pay

## 2015-11-23 ENCOUNTER — Ambulatory Visit (HOSPITAL_COMMUNITY): Payer: Medicare Other | Attending: Cardiology

## 2015-11-23 ENCOUNTER — Other Ambulatory Visit: Payer: Self-pay | Admitting: Cardiovascular Disease

## 2015-11-23 DIAGNOSIS — Z8249 Family history of ischemic heart disease and other diseases of the circulatory system: Secondary | ICD-10-CM | POA: Insufficient documentation

## 2015-11-23 DIAGNOSIS — I341 Nonrheumatic mitral (valve) prolapse: Secondary | ICD-10-CM | POA: Insufficient documentation

## 2015-11-23 DIAGNOSIS — R7881 Bacteremia: Secondary | ICD-10-CM

## 2015-11-23 DIAGNOSIS — I340 Nonrheumatic mitral (valve) insufficiency: Secondary | ICD-10-CM | POA: Diagnosis not present

## 2015-12-01 ENCOUNTER — Ambulatory Visit: Payer: Medicare Other | Admitting: Cardiovascular Disease

## 2015-12-19 ENCOUNTER — Encounter: Payer: Self-pay | Admitting: Physician Assistant

## 2015-12-25 DIAGNOSIS — Z23 Encounter for immunization: Secondary | ICD-10-CM | POA: Diagnosis not present

## 2015-12-27 NOTE — Progress Notes (Signed)
Patient ID: Lisa Cabrera, female   DOB: 1945/07/31, 70 y.o.   MRN: [id number] 70 y.o.  seen by Dr Gean Quint of [facility] September 2014 . Had been ill since dental appt in March. 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 BC positive for strep in early September F/U cultures in late September and October negative ? Janeway lesions on skin exam She indicates history of murmur. In hospital echo and TEE with no vegetations Rx with RUE PIC line with 4 weeks of iv antibiotics. Currently feels well   Study Conclusions Sept 2014   - Left ventricle: The cavity size was normal. Wall thickness was normal. Systolic function was normal. The estimated ejection fraction was in the range of 60% to 65%. Wall motion was normal; there were no regional wall motion abnormalities. - Aortic valve: No evidence of vegetation. - Mitral valve: Moderate, late systolicprolapse, involving the anterior leaflet. Moderate regurgitation directed eccentrically and posteriorly. - Left atrium: No evidence of thrombus in the atrial cavity or appendage. No spontaneous echo contrast was observed. - Right atrium: No evidence of thrombus in the atrial cavity or appendage. - Atrial septum: There was no atrial level shunt. - Tricuspid valve: Mild, holosystolicprolapse. Moderate regurgitation directed centrally. - Pulmonic valve: No evidence of vegetation. - Pericardium, extracardiac: A trivial pericardial effusion was identified posterior to the heart. The fluid had no internal echoes.There was no evidence of hemodynamic compromise. Impressions:  - Vegetation is absent.   Echo from 11/24/14 also reviewed and moderate MR with prolapse  Echo 11/23/15:  STable estimated PA 31 mmHg  Impressions:  - Normal LV size with EF 60-65%. Normal RV size and systolic   function. Mitral valve prolapse with very eccentric   posteriorly-directed mitral regurgitation. MR is poorly   visualized but probably moderate.     ROS: Denies fever,  malais, weight loss, blurry vision, decreased visual acuity, cough, sputum, SOB, hemoptysis, pleuritic pain, palpitaitons, heartburn, abdominal pain, melena, lower extremity edema, claudication, or rash.  All other systems reviewed and negative  General: Affect appropriate Healthy:  appears stated age 80: normal Neck supple with no adenopathy JVP normal no bruits no thyromegaly Lungs clear with no wheezing and good diaphragmatic motion Heart:  S1/S2 mid peaking MR  murmur, no rub, gallop or click PMI normal Abdomen: benighn, BS positve, no tenderness, no AAA no bruit.  No HSM or HJR Distal pulses intact with no bruits No edema Neuro non-focal Skin warm and dry No muscular weakness   Current Outpatient Prescriptions  Medication Sig Dispense Refill  . aspirin 81 MG tablet Take 81 mg by mouth daily.    . calcium gluconate 500 MG tablet Take 2-3 tablets by mouth daily.    . Cholecalciferol (VITAMIN D) 2000 UNITS CAPS Take 1 capsule by mouth daily.     Marland Kitchen doxepin (SINEQUAN) 25 MG capsule Take 25 mg by mouth daily.  4  . metroNIDAZOLE (METROGEL) 1 % gel Apply 1 application topically daily. ROSACEA    . Multiple Vitamins-Minerals (MULTIVITAMIN PO) Take 1 tablet by mouth daily.     . Omega-3 Fatty Acids (FISH OIL PO) Take 1 tablet by mouth daily.     . verapamil (VERELAN PM) 240 MG 24 hr capsule Take 240 mg by mouth daily.     No current facility-administered medications for this visit.     Allergies  Review of patient's allergies indicates no known allergies.  Electrocardiogram:  SR rate 74 normal  11/30/14  SR rate 66 normal possible LAE 01/01/16  Sr rate 73 [id number]   Assessment and Plan HTN:  Well controlled.  Continue current medications and low sodium Dash type diet.   MVP/MR:  Stable moderate regurgitation with prolapse over last 2 years LV compensated  SBE: previous with preexisting MVP  Resolved SBE prophylaxis life long  [name]

## 2016-01-01 ENCOUNTER — Encounter: Payer: Self-pay | Admitting: Cardiovascular Disease

## 2016-01-01 ENCOUNTER — Ambulatory Visit (INDEPENDENT_AMBULATORY_CARE_PROVIDER_SITE_OTHER): Payer: Medicare Other | Admitting: Cardiovascular Disease

## 2016-01-01 VITALS — BP 134/76 | HR 78 | Ht 61.5 in | Wt 126.0 lb

## 2016-01-01 DIAGNOSIS — I341 Nonrheumatic mitral (valve) prolapse: Secondary | ICD-10-CM | POA: Diagnosis not present

## 2016-01-01 NOTE — Patient Instructions (Addendum)

## 2016-02-02 DIAGNOSIS — Z1231 Encounter for screening mammogram for malignant neoplasm of breast: Secondary | ICD-10-CM | POA: Diagnosis not present

## 2016-04-24 DIAGNOSIS — H2513 Age-related nuclear cataract, bilateral: Secondary | ICD-10-CM | POA: Diagnosis not present

## 2016-05-30 DIAGNOSIS — D225 Melanocytic nevi of trunk: Secondary | ICD-10-CM | POA: Diagnosis not present

## 2016-05-30 DIAGNOSIS — L814 Other melanin hyperpigmentation: Secondary | ICD-10-CM | POA: Diagnosis not present

## 2016-05-30 DIAGNOSIS — L821 Other seborrheic keratosis: Secondary | ICD-10-CM | POA: Diagnosis not present

## 2016-05-30 DIAGNOSIS — D1800 Hemangioma unspecified site: Secondary | ICD-10-CM | POA: Diagnosis not present

## 2016-07-19 DIAGNOSIS — R1030 Lower abdominal pain, unspecified: Secondary | ICD-10-CM | POA: Diagnosis not present

## 2016-08-01 ENCOUNTER — Encounter: Payer: Self-pay | Admitting: Physical Therapy

## 2016-08-01 ENCOUNTER — Ambulatory Visit: Payer: Medicare Other | Attending: Family Medicine | Admitting: Physical Therapy

## 2016-08-01 DIAGNOSIS — M6281 Muscle weakness (generalized): Secondary | ICD-10-CM | POA: Insufficient documentation

## 2016-08-01 DIAGNOSIS — M25551 Pain in right hip: Secondary | ICD-10-CM | POA: Insufficient documentation

## 2016-08-01 DIAGNOSIS — M25651 Stiffness of right hip, not elsewhere classified: Secondary | ICD-10-CM | POA: Insufficient documentation

## 2016-08-01 NOTE — Patient Instructions (Signed)
    Hold 30 sec repeat 3 x each side    Hold 30 sec or to tolerance repeat 3x/day  [facility] [address] [place], [place] [address] Phone # [phone] Fax [phone]

## 2016-08-02 NOTE — Therapy (Signed)
[facility] [address] [place], [place], [address] Phone: [phone]   Fax:  [phone]  Physical Therapy Evaluation  Patient Details  Name: Lisa Cabrera MRN: [id number] Date of Birth: 1945/04/16 Referring Provider: Leighton Ruff  Encounter Date: 08/01/2016      PT End of Session - 08/01/16 1524    Visit Number 1   Number of Visits 10   Date for PT Re-Evaluation 09/26/16   PT Start Time 5102   PT Stop Time 1525   PT Time Calculation (min) 38 min   Activity Tolerance Patient tolerated treatment well   Behavior During Therapy [facility] for tasks assessed/performed      Past Medical History:  Diagnosis Date  . Anemia    normocytic  . Complication of anesthesia    HAD RAPID HEART BEAT AFTER ANESTHIA  . Family history of anesthesia complication    Father had difficulty waking up  . H/O seasonal allergies   . Heart murmur     Past Surgical History:  Procedure Laterality Date  . ABDOMINAL HYSTERECTOMY     1997  . LUMBAR DISC SURGERY     herniated disk  . SPINE SURGERY    . TEE WITHOUT CARDIOVERSION N/A 12/07/2012   Procedure: TRANSESOPHAGEAL ECHOCARDIOGRAM (TEE);  Surgeon: Sanda Klein, MD;  Location: [facility] ENDOSCOPY;  Service: Cardiovascular;  Laterality: N/A;    There were no vitals filed for this visit.       Subjective Assessment - 08/01/16 1448    Subjective Right groin pain and was when walking and sometimes it just comes on when taking a step and then it will go away.  This has been going on for 6 weeks.  I was walking 2 miles 3-4 x/week but that was 2 weeks ago.  Now I go to silver sneakers classes.     Limitations Walking   How long can you walk comfortably? I haven't been walking the way I used to and I favor it when walking   Patient Stated Goals walk 3x/week 1-2 miles, make sure the pain doesn't come back   Currently in Pain? Yes  not having currently but when standing   Pain Score 7     Pain Location Groin   Pain Orientation Right   Pain Descriptors / Indicators Sharp   Pain Type Acute pain   Pain Onset More than a month ago   Pain Frequency Intermittent   Aggravating Factors  walking and standing   Pain Relieving Factors sitting resting   Effect of Pain on Daily Activities Not walking as much as you would like   Multiple Pain Sites No            [facility] PT Assessment - 08/02/16 0001      Assessment   Medical Diagnosis R10.3 right groin pain   Onset Date/Surgical Date 06/20/16   Prior Therapy No     Precautions   Precautions None     Restrictions   Weight Bearing Restrictions No     Home Environment   Living Environment Private residence   Living Arrangements Spouse/significant other     Prior Function   Level of Independence Independent   Vocation Retired   Astronomer, plays ukulele in a band     Cognition   Overall Cognitive Status Within Functional Limits for tasks assessed     Observation/Other Assessments   Focus on Therapeutic Outcomes (FOTO)  45% limited  goal 34% limited  Posture/Postural Control   Posture/Postural Control Postural limitations   Postural Limitations Decreased thoracic kyphosis;Decreased lumbar lordosis   Posture Comments spine fusion T1 to L5 due to scoliosis     AROM   Overall AROM Comments right hip abduction, flexion, internal rotation painful and limited     PROM   Right Hip Internal Rotation  3     Strength   Overall Strength Comments core strength 4/5; overally LE 4+/5 MMT     Right Hip   Right Hip Flexion 105   Right Hip ABduction 16     Flexibility   Soft Tissue Assessment /Muscle Length --  hamstring limited bilaterally     Palpation   Palpation comment right TFL tight and tender     Special Tests    Special Tests Hip Special Tests   Hip Special Tests  Saralyn Pilar [facility]) Test     Saralyn Pilar [facility]) Test   Findings Positive   Side Right     Ambulation/Gait   Gait Pattern  Decreased stride length;Decreased arm swing - right;Decreased arm swing - left                   [facility] Adult PT Treatment/Exercise - 08/02/16 0001      Exercises   Exercises --  educated and performed HEP                PT Education - 08/01/16 1524    Education provided Yes   Education Details hamstring and TFL stretch   Person(s) Educated Patient   Methods Explanation;Demonstration;Tactile cues;Verbal cues;Handout   Comprehension Verbalized understanding;Returned demonstration          PT Short Term Goals - 08/02/16 1948      PT SHORT TERM GOAL #1   Title Pt will be independent with initial HEP   Time 4   Period Weeks   Status New     PT SHORT TERM GOAL #2   Title Pt will have 25% less episodes of pain when walking   Time 4   Period Weeks   Status New     PT SHORT TERM GOAL #3   Title Increased hip PROM to 10 degrees of internal rotation and 110 degrees of flexion for improved function movements such as bending forward and pivoting.   Time 4   Period Weeks   Status New           PT Long Term Goals - 08/02/16 1949      PT LONG TERM GOAL #1   Title FOTO < or = to 34%   Time 8   Period Weeks   Status New     PT LONG TERM GOAL #2   Title Pt will have 75% less occurance of pain   Time 8   Period Weeks   Status New     PT LONG TERM GOAL #3   Title pt will be independent with advanced HEP to maintain ROM and strength    Time 8   Period Weeks   Status New     PT LONG TERM GOAL #4   Title Pt will be able to walk 1 mile 3x/week without increased episoded of pain   Time 8   Period Weeks   Status New               Plan - 08/02/16 1942    Clinical Impression Statement Pt presents to clinic for right groin pain that has been going on for  the past month.  States it has improved, but still feeling it when she walks and comes and goes.  She has some LE and core weakness oveall.  Right hip is stiff and painful with flexion, internal  rotation, and abduction.  Pt has not been able to participate as much in healthy routines like exercise classes.  Pt also has very tight TFL muslce on the right hip.  Pt has postural and gait deficits.  She will benefit from skilled PT to address these fucntions.  She is low complexity due to stable condition.     Rehab Potential Good   Clinical Impairments Affecting Rehab Potential spinal fusion   PT Frequency 2x / week   PT Duration 8 weeks   PT Treatment/Interventions ADLs/Self Care Home Management;Biofeedback;Cryotherapy;Electrical Stimulation;Moist Heat;Ultrasound;Gait training;Stair training;Functional mobility training;Therapeutic activities;Therapeutic exercise;Balance training;Neuromuscular re-education;Patient/family education;Manual techniques;Passive range of motion;Dry needling;Taping   PT Next Visit Plan manual to TFL, hip mobs, ROM and stretching, LE and core strengthening   Recommended Other Services n/a   Consulted and Agree with Plan of Care Patient      Patient will benefit from skilled therapeutic intervention in order to improve the following deficits and impairments:  Decreased activity tolerance, Decreased endurance, Decreased range of motion, Decreased strength, Hypomobility, Pain, Postural dysfunction, Increased muscle spasms  Visit Diagnosis: Pain in right hip - Plan: PT plan of care cert/re-cert  Stiffness of right hip, not elsewhere classified - Plan: PT plan of care cert/re-cert  Muscle weakness (generalized) - Plan: PT plan of care cert/re-cert      G-Codes - 62/22/97 1921    Functional Limitation Mobility: Walking and moving around   Mobility: Walking and Moving Around Current Status [phone]) At least 40 percent but less than 60 percent impaired, limited or restricted   Mobility: Walking and Moving Around Goal Status [phone]) At least 20 percent but less than 40 percent impaired, limited or restricted       Problem List Patient Active Problem List    Diagnosis Date Noted  . Streptococcal bacteremia 12/05/2012  . Normocytic anemia 12/05/2012  . Hyperglycemia 12/05/2012  . 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, [place], [place], [address] Phone: [phone]   Fax:  [phone]  Name: GENEAL HUEBERT MRN: [id number] Date of Birth: 12-Dec-1945

## 2016-08-08 ENCOUNTER — Ambulatory Visit: Payer: Medicare Other | Admitting: Physical Therapy

## 2016-08-08 ENCOUNTER — Encounter: Payer: Self-pay | Admitting: Physical Therapy

## 2016-08-08 DIAGNOSIS — M25651 Stiffness of right hip, not elsewhere classified: Secondary | ICD-10-CM

## 2016-08-08 DIAGNOSIS — M25551 Pain in right hip: Secondary | ICD-10-CM

## 2016-08-08 DIAGNOSIS — M6281 Muscle weakness (generalized): Secondary | ICD-10-CM | POA: Diagnosis not present

## 2016-08-08 NOTE — Patient Instructions (Signed)
Piriformis Stretch - Supine    Pull uninvolved knee across body toward opposite shoulder. Hold slight stretch for _10__ seconds. Repeat with involved leg. Repeat _2__ times. Do _AS NEEDED__ times per day.  Copyright  VHI. All rights reserved.   Flexors, Lunge    Step into a deep forward lunge, hands on thigh, front knee not past line of toes, other knee lightly touching floor. Push back leg straight, raising knee. Hold __10_ seconds.  Repeat _2__ times per session. Do _AS NEEDED__ sessions per day.  Copyright  VHI. All rights reserved.   Clam Shell 45 Degrees    Lying with hips and knees bent 45, one pillow between knees and ankles. Lift knee. Be sure pelvis does not roll backward. Do not arch back. Do _20__ times, each leg, _1__ times per day.  http://ss.exer.us/75   Mikle Bosworth, PTA 08/08/16 3:18 PM  [facility] [address], [place], [place] [address] Phone # [phone] Fax [phone]

## 2016-08-08 NOTE — Therapy (Signed)
[facility] [address], [place], [place], [address] Phone: [phone]   Fax:  [phone]  Physical Therapy Treatment  Patient Details  Name: Lisa Cabrera MRN: [id number] Date of Birth: 1945/08/04 Referring Provider: Leighton Ruff  Encounter Date: 08/08/2016      PT End of Session - 08/08/16 1449    Visit Number 2   Number of Visits 10   Date for PT Re-Evaluation 09/26/16   PT Start Time 7253   PT Stop Time 1523   PT Time Calculation (min) 38 min   Activity Tolerance Patient tolerated treatment well   Behavior During Therapy [facility] for tasks assessed/performed      Past Medical History:  Diagnosis Date  . Anemia    normocytic  . Complication of anesthesia    HAD RAPID HEART BEAT AFTER ANESTHIA  . Family history of anesthesia complication    Father had difficulty waking up  . H/O seasonal allergies   . Heart murmur     Past Surgical History:  Procedure Laterality Date  . ABDOMINAL HYSTERECTOMY     1997  . LUMBAR DISC SURGERY     herniated disk  . SPINE SURGERY    . TEE WITHOUT CARDIOVERSION N/A 12/07/2012   Procedure: TRANSESOPHAGEAL ECHOCARDIOGRAM (TEE);  Surgeon: Sanda Klein, MD;  Location: [facility] ENDOSCOPY;  Service: Cardiovascular;  Laterality: N/A;    There were no vitals filed for this visit.      Subjective Assessment - 08/08/16 1449    Subjective I feel like it's doing a little bit better, it hasn't acted up much in the past week.    Limitations Walking   How long can you walk comfortably? I haven't been walking the way I used to and I favor it when walking   Patient Stated Goals walk 3x/week 1-2 miles, make sure the pain doesn't come back   Currently in Pain? No/denies   Pain Score 0-No pain                         [facility] Adult PT Treatment/Exercise - 08/08/16 0001      Exercises   Exercises Knee/Hip     Knee/Hip Exercises: Stretches   Passive Hamstring  Stretch Both;2 reps;10 seconds   Hip Flexor Stretch Right;10 seconds  On step   ITB Stretch Right;2 reps;10 seconds   Gastroc Stretch Both;2 reps;10 seconds     Knee/Hip Exercises: Aerobic   Nustep L1 x 5 minutes  Therapist present to discuss treatment     Knee/Hip Exercises: Standing   Knee Flexion AROM;Both;2 sets;10 reps  hamstring curls   Hip Abduction AROM;Both;2 sets;10 reps   Hip Extension AROM;Both;2 sets;10 reps     Knee/Hip Exercises: Supine   Hip Adduction Isometric Strengthening;Both;20 reps   Bridges Strengthening;Both;1 set;10 reps  with ball squeeze     Knee/Hip Exercises: Sidelying   Clams 2x10                 PT Education - 08/08/16 1520    Education provided Yes   Education Details Clamshells, hip flexor stretch   Person(s) Educated Patient   Methods Explanation;Demonstration   Comprehension Verbalized understanding          PT Short Term Goals - 08/08/16 1450      PT SHORT TERM GOAL #1   Title Pt will be independent with initial HEP   Time 4   Period Weeks   Status  On-going     PT SHORT TERM GOAL #2   Title Pt will have 25% less episodes of pain when walking   Time 4   Period Weeks   Status On-going     PT SHORT TERM GOAL #3   Title Increased hip PROM to 10 degrees of internal rotation and 110 degrees of flexion for improved function movements such as bending forward and pivoting.   Time 4   Period Weeks   Status On-going           PT Long Term Goals - 08/08/16 1450      PT LONG TERM GOAL #1   Title FOTO < or = to 34%   Time 8   Period Weeks   Status On-going     PT LONG TERM GOAL #2   Title Pt will have 75% less occurance of pain   Time 8   Period Weeks   Status On-going     PT LONG TERM GOAL #3   Title pt will be independent with advanced HEP to maintain ROM and strength    Time 8   Period Weeks   Status On-going     PT LONG TERM GOAL #4   Title Pt will be able to walk 1 mile 3x/week without increased  episoded of pain   Time 8   Period Weeks   Status On-going               Plan - 08/08/16 1527    Clinical Impression Statement Patient reports HEP has helped to reduce groin pain. Patient did well with all exercises. Felt "pulling" in groin with hip extension exercises but not painful. Patient responded well to all stretches. Reviewed previous HEP and added new stretches wich patient verbalized understanding. Patient will continue to benefit from skilled thearpy for hip strength and stability.    Rehab Potential Good   Clinical Impairments Affecting Rehab Potential spinal fusion   PT Frequency 2x / week   PT Duration 8 weeks   PT Treatment/Interventions ADLs/Self Care Home Management;Biofeedback;Cryotherapy;Electrical Stimulation;Moist Heat;Ultrasound;Gait training;Stair training;Functional mobility training;Therapeutic activities;Therapeutic exercise;Balance training;Neuromuscular re-education;Patient/family education;Manual techniques;Passive range of motion;Dry needling;Taping   PT Next Visit Plan manual to TFL, hip mobs, ROM and stretching, LE and core strengthening   Consulted and Agree with Plan of Care Patient      Patient will benefit from skilled therapeutic intervention in order to improve the following deficits and impairments:  Decreased activity tolerance, Decreased endurance, Decreased range of motion, Decreased strength, Hypomobility, Pain, Postural dysfunction, Increased muscle spasms  Visit Diagnosis: Pain in right hip  Stiffness of right hip, not elsewhere classified  Muscle weakness (generalized)     Problem List Patient Active Problem List   Diagnosis Date Noted  . Streptococcal bacteremia 12/05/2012  . Normocytic anemia 12/05/2012  . Hyperglycemia 12/05/2012  . 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 [place],  [place], [address] Phone: [phone]   Fax:  [phone]  Name: Lisa Cabrera MRN: [id number] Date of Birth: March 11, 1946

## 2016-08-15 ENCOUNTER — Encounter: Payer: Self-pay | Admitting: Physical Therapy

## 2016-08-15 ENCOUNTER — Ambulatory Visit: Payer: Medicare Other | Admitting: Physical Therapy

## 2016-08-15 DIAGNOSIS — M25651 Stiffness of right hip, not elsewhere classified: Secondary | ICD-10-CM | POA: Diagnosis not present

## 2016-08-15 DIAGNOSIS — M6281 Muscle weakness (generalized): Secondary | ICD-10-CM | POA: Diagnosis not present

## 2016-08-15 DIAGNOSIS — M25551 Pain in right hip: Secondary | ICD-10-CM

## 2016-08-15 NOTE — Therapy (Signed)
[facility] [address] [place], [place], [address] Phone: [phone]   Fax:  [phone]  Physical Therapy Treatment  Patient Details  Name: Lisa Cabrera MRN: [id number] Date of Birth: Mar 19, 1946 Referring Provider: Leighton Ruff  Encounter Date: 08/15/2016      PT End of Session - 08/15/16 1447    Visit Number 3   Number of Visits 10   Date for PT Re-Evaluation 09/26/16   PT Start Time 5974   PT Stop Time 1530   PT Time Calculation (min) 45 min   Activity Tolerance Patient tolerated treatment well   Behavior During Therapy [facility] for tasks assessed/performed      Past Medical History:  Diagnosis Date  . Anemia    normocytic  . Complication of anesthesia    HAD RAPID HEART BEAT AFTER ANESTHIA  . Family history of anesthesia complication    Father had difficulty waking up  . H/O seasonal allergies   . Heart murmur     Past Surgical History:  Procedure Laterality Date  . ABDOMINAL HYSTERECTOMY     1997  . LUMBAR DISC SURGERY     herniated disk  . SPINE SURGERY    . TEE WITHOUT CARDIOVERSION N/A 12/07/2012   Procedure: TRANSESOPHAGEAL ECHOCARDIOGRAM (TEE);  Surgeon: Sanda Klein, MD;  Location: [facility] ENDOSCOPY;  Service: Cardiovascular;  Laterality: N/A;    There were no vitals filed for this visit.      Subjective Assessment - 08/15/16 1447    Subjective My hip is doing better for hte most part. It bothered me a little a couple of times but I think it's moving in the right direction.    Limitations Walking   How long can you walk comfortably? I haven't been walking the way I used to and I favor it when walking   Patient Stated Goals walk 3x/week 1-2 miles, make sure the pain doesn't come back   Currently in Pain? No/denies   Pain Score 0-No pain                         [facility] Adult PT Treatment/Exercise - 08/15/16 0001      Knee/Hip Exercises: Stretches   Passive  Hamstring Stretch Both;2 reps;10 seconds   Hip Flexor Stretch Right;10 seconds  On step   ITB Stretch Right;2 reps;10 seconds   Gastroc Stretch Both;2 reps;10 seconds     Knee/Hip Exercises: Aerobic   Nustep L1 x 7 minutes  Therapist present to discuss treatment     Knee/Hip Exercises: Standing   Knee Flexion AROM;Both;2 sets;10 reps  hamstring curls   Hip Abduction AROM;Both;2 sets;10 reps   Hip Extension AROM;Both;2 sets;10 reps     Knee/Hip Exercises: Seated   Sit to Sand 10 reps     Knee/Hip Exercises: Supine   Hip Adduction Isometric Strengthening;Both;20 reps   Bridges Strengthening;Both;10 reps;2 sets  with ball squeeze     Knee/Hip Exercises: Sidelying   Clams 2x10      Manual Therapy   Manual Therapy Soft tissue mobilization   Soft tissue mobilization Lt glute, piriformis, ITB                  PT Short Term Goals - 08/15/16 1448      PT SHORT TERM GOAL #1   Title Pt will be independent with initial HEP   Time 4   Period Weeks   Status On-going  PT SHORT TERM GOAL #2   Title Pt will have 25% less episodes of pain when walking   Time 4   Period Weeks   Status On-going     PT SHORT TERM GOAL #3   Title Increased hip PROM to 10 degrees of internal rotation and 110 degrees of flexion for improved function movements such as bending forward and pivoting.   Time 4   Period Weeks   Status On-going           PT Long Term Goals - 08/15/16 1448      PT LONG TERM GOAL #1   Title FOTO < or = to 34%   Time 8   Period Weeks   Status On-going     PT LONG TERM GOAL #2   Title Pt will have 75% less occurance of pain   Time 8   Period Weeks   Status On-going             Patient will benefit from skilled therapeutic intervention in order to improve the following deficits and impairments:     Visit Diagnosis: Pain in right hip  Stiffness of right hip, not elsewhere classified  Muscle weakness (generalized)     Problem  List Patient Active Problem List   Diagnosis Date Noted  . Streptococcal bacteremia 12/05/2012  . Normocytic anemia 12/05/2012  . Hyperglycemia 12/05/2012  . 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, [place], [place], [address] Phone: [phone]   Fax:  [phone]  Name: Lisa Cabrera MRN: [id number] Date of Birth: 1946-03-05

## 2016-08-22 ENCOUNTER — Ambulatory Visit: Payer: Medicare Other | Admitting: Physical Therapy

## 2016-08-22 ENCOUNTER — Encounter: Payer: Self-pay | Admitting: Physical Therapy

## 2016-08-22 DIAGNOSIS — M25551 Pain in right hip: Secondary | ICD-10-CM | POA: Diagnosis not present

## 2016-08-22 DIAGNOSIS — M25651 Stiffness of right hip, not elsewhere classified: Secondary | ICD-10-CM | POA: Diagnosis not present

## 2016-08-22 DIAGNOSIS — M6281 Muscle weakness (generalized): Secondary | ICD-10-CM | POA: Diagnosis not present

## 2016-08-22 NOTE — Therapy (Signed)
[facility] [address] [place], [place], [address] Phone: [phone]   Fax:  [phone]  Physical Therapy Treatment  Patient Details  Name: Lisa Cabrera MRN: [id number] Date of Birth: 11/13/1945 Referring Provider: Leighton Ruff  Encounter Date: 08/22/2016      PT End of Session - 08/22/16 1445    Visit Number 4   Number of Visits 10   Date for PT Re-Evaluation 09/26/16   PT Start Time 2979   PT Stop Time 1521   PT Time Calculation (min) 39 min   Activity Tolerance Patient tolerated treatment well   Behavior During Therapy [facility] for tasks assessed/performed      Past Medical History:  Diagnosis Date  . Anemia    normocytic  . Complication of anesthesia    HAD RAPID HEART BEAT AFTER ANESTHIA  . Family history of anesthesia complication    Father had difficulty waking up  . H/O seasonal allergies   . Heart murmur     Past Surgical History:  Procedure Laterality Date  . ABDOMINAL HYSTERECTOMY     1997  . LUMBAR DISC SURGERY     herniated disk  . SPINE SURGERY    . TEE WITHOUT CARDIOVERSION N/A 12/07/2012   Procedure: TRANSESOPHAGEAL ECHOCARDIOGRAM (TEE);  Surgeon: Sanda Klein, MD;  Location: [facility] ENDOSCOPY;  Service: Cardiovascular;  Laterality: N/A;    There were no vitals filed for this visit.      Subjective Assessment - 08/22/16 1443    Subjective Hip feeling tight in groin area mostly. I feel like I just limp around a little. We went to the beach this past week so I wasn't stretching or donig my exercises like I should have.    Limitations Walking   How long can you walk comfortably? I haven't been walking the way I used to and I favor it when walking   Patient Stated Goals walk 3x/week 1-2 miles, make sure the pain doesn't come back   Currently in Pain? Yes   Pain Score 4    Pain Location Groin   Pain Orientation Right   Pain Descriptors / Indicators Sharp   Pain Type Acute pain    Pain Onset More than a month ago   Pain Frequency Intermittent                         [facility] Adult PT Treatment/Exercise - 08/22/16 0001      Neuro Re-ed    Neuro Re-ed Details  Activation of glutes, core, hamstrings  Balance and proprioception     Knee/Hip Exercises: Stretches   Quad Stretch Right  Supine leg hang   Hip Flexor Stretch Right;10 seconds  On step     Knee/Hip Exercises: Aerobic   Nustep L1 x 7 minutes  Therapist present to discuss treatment     Knee/Hip Exercises: Machines for Strengthening   Cybex Leg Press #55 Bil 2x10  Seat 5     Knee/Hip Exercises: Standing   Knee Flexion Both;2 sets;10 reps  hamstring curls   Hip Abduction Stengthening;Both;1 set;10 reps  yellow band   Hip Extension Stengthening;Both;1 set;10 reps  yellow band   SLS 2x 30 seconds eat  on blue pod     Knee/Hip Exercises: Seated   Ball Squeeze x20 with abdominal brace   Sit to Sand 10 reps  VC for knee/hip alignment     Knee/Hip Exercises: Supine   Hip Adduction Isometric  Strengthening;Both;20 reps   Bridges with Clamshell Strengthening;Both;2 sets;10 reps  Yellow band   Single Leg Bridge --     Knee/Hip Exercises: Sidelying   Clams 2x10   yellow band     Knee/Hip Exercises: Prone   Hamstring Curl --                  PT Short Term Goals - 08/22/16 1445      PT SHORT TERM GOAL #1   Title Pt will be independent with initial HEP   Baseline did not do this week but has been compliant   Status Achieved     PT SHORT TERM GOAL #2   Title Pt will have 25% less episodes of pain when walking   Time 4   Period Weeks   Status On-going     PT SHORT TERM GOAL #3   Title Increased hip PROM to 10 degrees of internal rotation and 110 degrees of flexion for improved function movements such as bending forward and pivoting.   Time 4   Period Weeks   Status On-going           PT Long Term Goals - 08/22/16 1446      PT LONG TERM GOAL #1   Title  FOTO < or = to 34%   Time 8   Period Weeks   Status On-going               Plan - 08/22/16 1659    Clinical Impression Statement Patient reports increased hip tightness today after vacation and no stretching or exercises. Patient did well with all strengthening today. Able to acheive good glute and core activation with no increase in pain. Patient will continue to benefit from skilled therapy for LE strneght and stability.    Rehab Potential Good   Clinical Impairments Affecting Rehab Potential spinal fusion   PT Frequency 2x / week   PT Duration 8 weeks   PT Treatment/Interventions ADLs/Self Care Home Management;Biofeedback;Cryotherapy;Electrical Stimulation;Moist Heat;Ultrasound;Gait training;Stair training;Functional mobility training;Therapeutic activities;Therapeutic exercise;Balance training;Neuromuscular re-education;Patient/family education;Manual techniques;Passive range of motion;Dry needling;Taping   PT Next Visit Plan Progress with standing strengthening    Consulted and Agree with Plan of Care Patient      Patient will benefit from skilled therapeutic intervention in order to improve the following deficits and impairments:  Decreased activity tolerance, Decreased endurance, Decreased range of motion, Decreased strength, Hypomobility, Pain, Postural dysfunction, Increased muscle spasms  Visit Diagnosis: Pain in right hip  Stiffness of right hip, not elsewhere classified  Muscle weakness (generalized)     Problem List Patient Active Problem List   Diagnosis Date Noted  . Streptococcal bacteremia 12/05/2012  . Normocytic anemia 12/05/2012  . Hyperglycemia 12/05/2012  . 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 [place], [place], [address] Phone: [phone]   Fax:  [phone]  Name: Lisa Cabrera MRN: [id number] Date of  Birth: [birth year]

## 2016-08-22 NOTE — Patient Instructions (Signed)
Hip Flexor Stretch    Lying on back near edge of bed, bend one leg, foot flat. Hang other leg over edge, relaxed, thigh resting entirely on bed for _20___ seconds. Repeat __2__ times. Do __AS NEEDED__ sessions per day. Advanced Exercise: Bend knee back keeping thigh in contact with bed.  http://gt2.exer.us/347   Copyright  VHI. All rights reserved.   Bridge    Lie back, legs bent. Inhale, pressing hips up. Keeping ribs in, lengthen lower back. Exhale, rolling down along spine from top. Repeat __10__ times. Do __1__ sessions per day.  http://pm.exer.us/55   Copyright  VHI. All rights reserved.  Mikle Bosworth, PTA 08/22/16 3:05 PM

## 2016-08-29 ENCOUNTER — Ambulatory Visit: Payer: Medicare Other | Admitting: Physical Therapy

## 2016-08-29 ENCOUNTER — Encounter: Payer: Self-pay | Admitting: Physical Therapy

## 2016-08-29 DIAGNOSIS — M25651 Stiffness of right hip, not elsewhere classified: Secondary | ICD-10-CM | POA: Diagnosis not present

## 2016-08-29 DIAGNOSIS — M6281 Muscle weakness (generalized): Secondary | ICD-10-CM | POA: Diagnosis not present

## 2016-08-29 DIAGNOSIS — M25551 Pain in right hip: Secondary | ICD-10-CM

## 2016-08-29 NOTE — Therapy (Signed)
[facility] [address], [place], [place], [address] Phone: [phone]   Fax:  [phone]  Physical Therapy Treatment  Patient Details  Name: Lisa Cabrera MRN: [id number] Date of Birth: 23-Oct-1945 Referring Provider: Leighton Ruff  Encounter Date: 08/29/2016      PT End of Session - 08/29/16 1450    Visit Number 5   Number of Visits 10   Date for PT Re-Evaluation 09/26/16   PT Start Time 4034   PT Stop Time 1523   PT Time Calculation (min) 38 min   Activity Tolerance Patient tolerated treatment well   Behavior During Therapy [facility] for tasks assessed/performed      Past Medical History:  Diagnosis Date  . Anemia    normocytic  . Complication of anesthesia    HAD RAPID HEART BEAT AFTER ANESTHIA  . Family history of anesthesia complication    Father had difficulty waking up  . H/O seasonal allergies   . Heart murmur     Past Surgical History:  Procedure Laterality Date  . ABDOMINAL HYSTERECTOMY     1997  . LUMBAR DISC SURGERY     herniated disk  . SPINE SURGERY    . TEE WITHOUT CARDIOVERSION N/A 12/07/2012   Procedure: TRANSESOPHAGEAL ECHOCARDIOGRAM (TEE);  Surgeon: Sanda Klein, MD;  Location: [facility] ENDOSCOPY;  Service: Cardiovascular;  Laterality: N/A;    There were no vitals filed for this visit.      Subjective Assessment - 08/29/16 1449    Subjective I can still feel a little bit of pain in the groin when I walk but it's not as bad as it used to be. Patient reports no pain at the moment.    Limitations Walking   How long can you walk comfortably? I haven't been walking the way I used to and I favor it when walking   Patient Stated Goals walk 3x/week 1-2 miles, make sure the pain doesn't come back   Currently in Pain? No/denies   Pain Score 0-No pain   Pain Location Groin   Pain Descriptors / Indicators Sharp   Pain Type Acute pain                         [facility] Adult  PT Treatment/Exercise - 08/29/16 0001      Neuro Re-ed    Neuro Re-ed Details  Activation of glutes, core, hamstrings  Balance and proprioception     Knee/Hip Exercises: Stretches   Quad Stretch Right  Supine leg hang   Hip Flexor Stretch Right;10 seconds  On step     Knee/Hip Exercises: Aerobic   Nustep L1 x 7 minutes  Therapist present to discuss treatment     Knee/Hip Exercises: Machines for Strengthening   Cybex Leg Press #60 Bil 2x10  Seat 5     Knee/Hip Exercises: Standing   Knee Flexion Both;2 sets;10 reps  hamstring curls   Hip Abduction Stengthening;Both;1 set;10 reps  yellow band   Hip Extension Stengthening;Both;1 set;10 reps  yellow band   SLS 2x 30 seconds eat  on blue pod     Knee/Hip Exercises: Seated   Ball Squeeze x20 with abdominal brace   Sit to Sand 10 reps  VC for knee/hip alignment     Knee/Hip Exercises: Supine   Bridges Strengthening;Both;10 reps;2 sets  With yellow band     Knee/Hip Exercises: Sidelying   Clams 2x10   yellow band  PT Short Term Goals - 08/22/16 1445      PT SHORT TERM GOAL #1   Title Pt will be independent with initial HEP   Baseline did not do this week but has been compliant   Status Achieved     PT SHORT TERM GOAL #2   Title Pt will have 25% less episodes of pain when walking   Time 4   Period Weeks   Status On-going     PT SHORT TERM GOAL #3   Title Increased hip PROM to 10 degrees of internal rotation and 110 degrees of flexion for improved function movements such as bending forward and pivoting.   Time 4   Period Weeks   Status On-going           PT Long Term Goals - 08/22/16 1446      PT LONG TERM GOAL #1   Title FOTO < or = to 34%   Time 8   Period Weeks   Status On-going               Plan - 08/29/16 1518    Clinical Impression Statement Patient reports pain in groin is much better than it used to be. Now pain is mainly while walking when right hip is  extended. Patient did well with all exercises. Continues to have some weakness in glutes with sinlge leg stance but able to improve with verbal cues. Patient will continue to benefit from skilled therapy for core and LE strengthening and stability.    Rehab Potential Good   Clinical Impairments Affecting Rehab Potential spinal fusion   PT Frequency 2x / week   PT Duration 8 weeks   PT Treatment/Interventions ADLs/Self Care Home Management;Biofeedback;Cryotherapy;Electrical Stimulation;Moist Heat;Ultrasound;Gait training;Stair training;Functional mobility training;Therapeutic activities;Therapeutic exercise;Balance training;Neuromuscular re-education;Patient/family education;Manual techniques;Passive range of motion;Dry needling;Taping   PT Next Visit Plan Progress glute and core strengthening, hip flexor stretching   Consulted and Agree with Plan of Care Patient      Patient will benefit from skilled therapeutic intervention in order to improve the following deficits and impairments:  Decreased activity tolerance, Decreased endurance, Decreased range of motion, Decreased strength, Hypomobility, Pain, Postural dysfunction, Increased muscle spasms  Visit Diagnosis: Pain in right hip  Stiffness of right hip, not elsewhere classified  Muscle weakness (generalized)     Problem List Patient Active Problem List   Diagnosis Date Noted  . Streptococcal bacteremia 12/05/2012  . Normocytic anemia 12/05/2012  . Hyperglycemia 12/05/2012  . 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, [place], [place], [address] Phone: [phone]   Fax:  [phone]  Name: [name] MRN: [id number] Date of Birth: Jan 14, 1946

## 2016-09-05 ENCOUNTER — Ambulatory Visit: Payer: Medicare Other | Attending: Family Medicine | Admitting: Physical Therapy

## 2016-09-05 ENCOUNTER — Encounter: Payer: Self-pay | Admitting: Physical Therapy

## 2016-09-05 DIAGNOSIS — M25651 Stiffness of right hip, not elsewhere classified: Secondary | ICD-10-CM | POA: Diagnosis not present

## 2016-09-05 DIAGNOSIS — M6281 Muscle weakness (generalized): Secondary | ICD-10-CM

## 2016-09-05 DIAGNOSIS — M25551 Pain in right hip: Secondary | ICD-10-CM

## 2016-09-05 NOTE — Therapy (Signed)
[facility] [address] [place], [place], [address] Phone: [phone]   Fax:  [phone]  Physical Therapy Treatment  Patient Details  Name: Lisa Cabrera MRN: [id number] Date of Birth: 10-18-1945 Referring Provider: Leighton Ruff  Encounter Date: 09/05/2016      PT End of Session - 09/05/16 1450    Visit Number 6   Number of Visits 10   Date for PT Re-Evaluation 09/26/16   PT Start Time 9371   PT Stop Time 1526   PT Time Calculation (min) 38 min   Activity Tolerance Patient tolerated treatment well   Behavior During Therapy [facility] for tasks assessed/performed      Past Medical History:  Diagnosis Date  . Anemia    normocytic  . Complication of anesthesia    HAD RAPID HEART BEAT AFTER ANESTHIA  . Family history of anesthesia complication    Father had difficulty waking up  . H/O seasonal allergies   . Heart murmur     Past Surgical History:  Procedure Laterality Date  . ABDOMINAL HYSTERECTOMY     1997  . LUMBAR DISC SURGERY     herniated disk  . SPINE SURGERY    . TEE WITHOUT CARDIOVERSION N/A 12/07/2012   Procedure: TRANSESOPHAGEAL ECHOCARDIOGRAM (TEE);  Surgeon: Sanda Klein, MD;  Location: [facility] ENDOSCOPY;  Service: Cardiovascular;  Laterality: N/A;    There were no vitals filed for this visit.      Subjective Assessment - 09/05/16 1450    Subjective I am not having any pain today and the walking has gotten much better.   Limitations Walking   Patient Stated Goals walk 3x/week 1-2 miles, make sure the pain doesn't come back   Currently in Pain? No/denies                         [facility] Adult PT Treatment/Exercise - 09/05/16 0001      Neuro Re-ed    Neuro Re-ed Details  Activation of glutes, core, hamstrings  Balance and proprioception     Knee/Hip Exercises: Stretches   Quad Stretch Right  Supine leg hang   Hip Flexor Stretch Right;10 seconds  On step     Knee/Hip Exercises: Aerobic   Nustep L1 x 7 minutes  Therapist present to discuss treatment     Knee/Hip Exercises: Machines for Strengthening   Cybex Leg Press #65 Bil 2x10  Seat 5     Knee/Hip Exercises: Standing   Knee Flexion Both;2 sets;10 reps  hamstring curls 1.5#   Hip Abduction Stengthening;Both;1 set;10 reps  1.5#   Hip Extension Stengthening;Both;1 set;10 reps  1.5#   SLS 2x 30 seconds eat  on foam mat     Knee/Hip Exercises: Seated   Ball Squeeze --   Sit to [employer] 10 reps  VC for knee/hip alignment     Knee/Hip Exercises: Supine   Bridges Strengthening;Both;10 reps;2 sets  With red band   Straight Leg Raises Strengthening;Both;20 reps   Other Supine Knee/Hip Exercises core stabilization with ball overhead - 20x     Knee/Hip Exercises: Sidelying   Clams 2x10   red band                  PT Short Term Goals - 09/05/16 1530      PT SHORT TERM GOAL #2   Title Pt will have 25% less episodes of pain when walking   Time 4   Period  Weeks   Status On-going     PT SHORT TERM GOAL #3   Title Increased hip PROM to 10 degrees of internal rotation and 110 degrees of flexion for improved function movements such as bending forward and pivoting.   Time 4   Period Weeks   Status On-going           PT Long Term Goals - 09/05/16 1530      PT LONG TERM GOAL #1   Title FOTO < or = to 34%   Time 8   Period Weeks   Status On-going     PT LONG TERM GOAL #2   Title Pt will have 75% less occurance of pain   Time 8   Period Weeks   Status On-going     PT LONG TERM GOAL #3   Title pt will be independent with advanced HEP to maintain ROM and strength    Time 8   Period Weeks   Status On-going     PT LONG TERM GOAL #4   Title Pt will be able to walk 1 mile 3x/week without increased episoded of pain   Time 8   Period Weeks   Status On-going               Plan - 09/05/16 1451    Clinical Impression Statement Patient continues to have less  pain overall. She states she still hasn't done much walking so she doesn't inflame the hip.  She was able to increase resistance and reps today and was educated on getting back to walking program starting with small distance like 1-2 laps around track at the [facility]..  Skilled PT needed to progress LE and core strength for return to walking program.   Clinical Impairments Affecting Rehab Potential spinal fusion   PT Treatment/Interventions ADLs/Self Care Home Management;Biofeedback;Cryotherapy;Electrical Stimulation;Moist Heat;Ultrasound;Gait training;Stair training;Functional mobility training;Therapeutic activities;Therapeutic exercise;Balance training;Neuromuscular re-education;Patient/family education;Manual techniques;Passive range of motion;Dry needling;Taping   PT Next Visit Plan Progress glute and core strengthening, hip flexor stretching   Consulted and Agree with Plan of Care Patient      Patient will benefit from skilled therapeutic intervention in order to improve the following deficits and impairments:  Decreased activity tolerance, Decreased endurance, Decreased range of motion, Decreased strength, Hypomobility, Pain, Postural dysfunction, Increased muscle spasms  Visit Diagnosis: Pain in right hip  Stiffness of right hip, not elsewhere classified  Muscle weakness (generalized)     Problem List Patient Active Problem List   Diagnosis Date Noted  . Streptococcal bacteremia 12/05/2012  . Normocytic anemia 12/05/2012  . Hyperglycemia 12/05/2012  . 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, [place], [place], [address] Phone: [phone]   Fax:  [phone]  Name: Lisa Cabrera MRN: [id number] Date of Birth: 1945-08-18

## 2016-09-12 ENCOUNTER — Ambulatory Visit: Payer: Medicare Other | Admitting: Physical Therapy

## 2016-09-12 DIAGNOSIS — M25651 Stiffness of right hip, not elsewhere classified: Secondary | ICD-10-CM | POA: Diagnosis not present

## 2016-09-12 DIAGNOSIS — M6281 Muscle weakness (generalized): Secondary | ICD-10-CM

## 2016-09-12 DIAGNOSIS — M25551 Pain in right hip: Secondary | ICD-10-CM | POA: Diagnosis not present

## 2016-09-12 NOTE — Therapy (Signed)
[facility] [address] [place], [place], [address] Phone: [phone]   Fax:  [phone]  Physical Therapy Treatment  Patient Details  Name: Lisa Cabrera MRN: [id number] Date of Birth: [birth year] Referring Provider: Leighton Ruff  Encounter Date: 09/12/2016      PT End of Session - 09/12/16 1505    Visit Number 7   Number of Visits 10   Date for PT Re-Evaluation 09/26/16   PT Start Time 2353   PT Stop Time 1525   PT Time Calculation (min) 40 min   Activity Tolerance Patient tolerated treatment well      Past Medical History:  Diagnosis Date  . Anemia    normocytic  . Complication of anesthesia    HAD RAPID HEART BEAT AFTER ANESTHIA  . Family history of anesthesia complication    Father had difficulty waking up  . H/O seasonal allergies   . Heart murmur     Past Surgical History:  Procedure Laterality Date  . ABDOMINAL HYSTERECTOMY     1997  . LUMBAR DISC SURGERY     herniated disk  . SPINE SURGERY    . TEE WITHOUT CARDIOVERSION N/A 12/07/2012   Procedure: TRANSESOPHAGEAL ECHOCARDIOGRAM (TEE);  Surgeon: Sanda Klein, MD;  Location: [facility] ENDOSCOPY;  Service: Cardiovascular;  Laterality: N/A;    There were no vitals filed for this visit.      Subjective Assessment - 09/12/16 1446    Subjective No pain in groin at present with does have occassional pain with straight ahead walking (mild.)   How long can you walk comfortably? 3/4 mile to 1 mile (Previously 1 1/2 miles)   Currently in Pain? Yes   Pain Score 1    Pain Location Groin   Pain Orientation Right   Pain Type Acute pain   Pain Onset More than a month ago   Pain Frequency Intermittent   Aggravating Factors  walking;  stepping down off a chair with right leg going down first   Pain Relieving Factors sitting                         [facility] Adult PT Treatment/Exercise - 09/12/16 0001      Therapeutic Activites     Therapeutic Activities Other Therapeutic Activities   Other Therapeutic Activities standing and walking     Neuro Re-ed    Neuro Re-ed Details  Activation of glutes, core, hamstrings  Balance and proprioception     Knee/Hip Exercises: Stretches   Active Hamstring Stretch Left;2 reps;30 seconds   Active Hamstring Stretch Limitations right leg straight for hip flexor stretch   Other Knee/Hip Stretches psoas doorway stretch with and without UEs     Knee/Hip Exercises: Aerobic   Nustep L1 x 7 minutes  Therapist present to discuss treatment     Knee/Hip Exercises: Machines for Strengthening   Cybex Leg Press #65 Bil 15x, single leg only 35# 15x  Seat 5     Knee/Hip Exercises: Standing   Hip Abduction Stengthening;Both;1 set;10 reps  red band   Hip Extension Stengthening;Both;1 set;10 reps  red band   Other Standing Knee Exercises SLS with toe touch red band UE diagonals 10x right/left   Other Standing Knee Exercises UE wall slides with gluteal squeeze 10x     Knee/Hip Exercises: Seated   Sit to Sand without UE support     Knee/Hip Exercises: Supine   Bridges Strengthening;Both;10 reps;2 sets  With red band and ball between knees   Other Supine Knee/Hip Exercises hip flexion isometric 5x each leg   Other Supine Knee/Hip Exercises clams with red band 3x5 double and single leg     Knee/Hip Exercises: Sidelying   Clams 2x10   red band                  PT Short Term Goals - 09/12/16 1522      PT SHORT TERM GOAL #1   Title Pt will be independent with initial HEP   Status Achieved     PT SHORT TERM GOAL #2   Title Pt will have 25% less episodes of pain when walking   Status Achieved     PT SHORT TERM GOAL #3   Title Increased hip PROM to 10 degrees of internal rotation and 110 degrees of flexion for improved function movements such as bending forward and pivoting.   Time 4   Period Weeks   Status On-going           PT Long Term Goals - 09/12/16 1526       PT LONG TERM GOAL #1   Title FOTO < or = to 34%   Time 8   Period Weeks   Status On-going     PT LONG TERM GOAL #2   Title Pt will have 75% less occurance of pain   Status Achieved     PT LONG TERM GOAL #3   Title pt will be independent with advanced HEP to maintain ROM and strength    Time 8   Period Weeks   Status On-going     PT LONG TERM GOAL #4   Title Pt will be able to walk 1 mile 3x/week without increased episoded of pain   Time 8   Period Weeks   Status On-going               Plan - 09/12/16 1505    Clinical Impression Statement The patient has decreasing pain and intermittent vs. constant in nature.  She reports the pain is 90% better than it was.   Able to do a progression of lumbo/pelvic/hip strengthening and a progression of stretching without pain exacerbation.  Minimal cues for patellofemoral alignment and for good technique to activate gluteals.  Good progress with rehab goals.     Rehab Potential Good   PT Frequency 2x / week   PT Duration 8 weeks   PT Treatment/Interventions ADLs/Self Care Home Management;Biofeedback;Cryotherapy;Electrical Stimulation;Moist Heat;Ultrasound;Gait training;Stair training;Functional mobility training;Therapeutic activities;Therapeutic exercise;Balance training;Neuromuscular re-education;Patient/family education;Manual techniques;Passive range of motion;Dry needling;Taping   PT Next Visit Plan Progress glute and core strengthening, hip flexor stretching; recheck hip ROM for STG      Patient will benefit from skilled therapeutic intervention in order to improve the following deficits and impairments:  Decreased activity tolerance, Decreased endurance, Decreased range of motion, Decreased strength, Hypomobility, Pain, Postural dysfunction, Increased muscle spasms  Visit Diagnosis: Pain in right hip  Stiffness of right hip, not elsewhere classified  Muscle weakness (generalized)     Problem List Patient Active  Problem List   Diagnosis Date Noted  . Streptococcal bacteremia 12/05/2012  . Normocytic anemia 12/05/2012  . Hyperglycemia 12/05/2012  . 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 [place], [place], [address] Phone: [phone]   Fax:  [phone]  Name: CLYDA SMYTH MRN: [id number] Date of Birth: 1945/04/28

## 2016-09-19 ENCOUNTER — Ambulatory Visit: Payer: Medicare Other | Admitting: Physical Therapy

## 2016-09-19 DIAGNOSIS — M25651 Stiffness of right hip, not elsewhere classified: Secondary | ICD-10-CM

## 2016-09-19 DIAGNOSIS — M25551 Pain in right hip: Secondary | ICD-10-CM

## 2016-09-19 DIAGNOSIS — M6281 Muscle weakness (generalized): Secondary | ICD-10-CM | POA: Diagnosis not present

## 2016-09-19 NOTE — Patient Instructions (Addendum)
     HIP: Flexors - Supine   Lie on edge of surface. Place leg off the surface, allow knee to bend. Bring other knee toward chest. Hold __30_ seconds. __3_ reps per set, __1_ sets per day, _7__ days per week Rest lowered foot on stool.   Hamstring stretch with strap:  Bias it side to side to stretch groin area a little    Bridging:  Add knees apart/together  "butterflies"       Ruben Im PT    [facility] [address] [place], [place] [address] Phone # [phone] Fax [phone]

## 2016-09-19 NOTE — Therapy (Signed)
[facility] [address], [place], [place], [address] Phone: [phone]   Fax:  [phone]  Physical Therapy Treatment  Patient Details  Name: Lisa Cabrera MRN: [id number] Date of Birth: 03/31/1946 Referring Provider: Leighton Ruff  Encounter Date: 09/19/2016      PT End of Session - 09/19/16 1518    Visit Number 8   Number of Visits 10   Date for PT Re-Evaluation 09/26/16   PT Start Time 8280   PT Stop Time 1528   PT Time Calculation (min) 43 min   Activity Tolerance Patient tolerated treatment well      Past Medical History:  Diagnosis Date  . Anemia    normocytic  . Complication of anesthesia    HAD RAPID HEART BEAT AFTER ANESTHIA  . Family history of anesthesia complication    Father had difficulty waking up  . H/O seasonal allergies   . Heart murmur     Past Surgical History:  Procedure Laterality Date  . ABDOMINAL HYSTERECTOMY     1997  . LUMBAR DISC SURGERY     herniated disk  . SPINE SURGERY    . TEE WITHOUT CARDIOVERSION N/A 12/07/2012   Procedure: TRANSESOPHAGEAL ECHOCARDIOGRAM (TEE);  Surgeon: Sanda Klein, MD;  Location: [facility] ENDOSCOPY;  Service: Cardiovascular;  Laterality: N/A;    There were no vitals filed for this visit.      Subjective Assessment - 09/19/16 1446    Subjective No pain today.  Last had groin pain a couple of days ago but extremely mild (walking).  Some right buttock pain but that's not a new thing.  Haven't done much walking b/c of the extreme heat.     Currently in Pain? No/denies   Pain Score 0-No pain   Pain Orientation Right                         [facility] Adult PT Treatment/Exercise - 09/19/16 0001      Therapeutic Activites    Other Therapeutic Activities standing and walking     Neuro Re-ed    Neuro Re-ed Details  Activation of glutes, core, hamstrings  Balance and proprioception     Knee/Hip Exercises: Stretches   Active  Hamstring Stretch Left;2 reps;30 seconds   Active Hamstring Stretch Limitations right leg straight for hip flexor stretch  side to side bias   Quad Stretch Right  Supine leg hang   Other Knee/Hip Stretches psoas doorway stretch with and without UEs     Knee/Hip Exercises: Aerobic   Nustep L2 10 min     Knee/Hip Exercises: Standing   Hip Abduction Stengthening;Both;1 set;10 reps  red band   Hip Extension Stengthening;Both;1 set;10 reps  red band   Other Standing Knee Exercises hip extension isometric against wall 5x 5x   Other Standing Knee Exercises hip abduction isometric 5x 5 sec hold     Knee/Hip Exercises: Supine   Bridges Strengthening;Both;10 reps   Bridges Limitations with butterfly knees                PT Education - 09/19/16 1509    Education provided Yes   Education Details supine hip flexor stretch;  progression of bridge and HS stretch;  red band hip extension   Person(s) Educated Patient   Methods Explanation;Demonstration;Handout   Comprehension Verbalized understanding;Returned demonstration          PT Short Term Goals - 09/19/16 1525  PT SHORT TERM GOAL #1   Title Pt will be independent with initial HEP   Status Achieved     PT SHORT TERM GOAL #2   Title Pt will have 25% less episodes of pain when walking   Status Achieved     PT SHORT TERM GOAL #3   Title Increased hip PROM to 10 degrees of internal rotation and 110 degrees of flexion for improved function movements such as bending forward and pivoting.   Time 4   Period Weeks   Status On-going           PT Long Term Goals - 09/19/16 1525      PT LONG TERM GOAL #1   Title FOTO < or = to 34%   Time 8   Period Weeks   Status On-going     PT LONG TERM GOAL #2   Title Pt will have 75% less occurance of pain   Status Achieved     PT LONG TERM GOAL #3   Title pt will be independent with advanced HEP to maintain ROM and strength    Time 8   Period Weeks   Status On-going      PT LONG TERM GOAL #4   Title Pt will be able to walk 1 mile 3x/week without increased episoded of pain   Time 8   Period Weeks   Status On-going               Plan - 09/19/16 1522    Clinical Impression Statement The patient reports decreasing pain intensity and frequency of groin pain.  Improved step length noted after hip flexor stretching.  Weakness right > left gluteals and evident with single limb standing.    Progressing with rehab goals and may be ready for discharge next visit if majority of goals met.     PT Frequency 2x / week   PT Duration 8 weeks   PT Treatment/Interventions ADLs/Self Care Home Management;Biofeedback;Cryotherapy;Electrical Stimulation;Moist Heat;Ultrasound;Gait training;Stair training;Functional mobility training;Therapeutic activities;Therapeutic exercise;Balance training;Neuromuscular re-education;Patient/family education;Manual techniques;Passive range of motion;Dry needling;Taping   PT Next Visit Plan Progress glute and core strengthening, hip flexor stretching; recheck hip ROM for goals;  do FOTO      Patient will benefit from skilled therapeutic intervention in order to improve the following deficits and impairments:  Decreased activity tolerance, Decreased endurance, Decreased range of motion, Decreased strength, Hypomobility, Pain, Postural dysfunction, Increased muscle spasms  Visit Diagnosis: Pain in right hip  Stiffness of right hip, not elsewhere classified  Muscle weakness (generalized)     Problem List Patient Active Problem List   Diagnosis Date Noted  . Streptococcal bacteremia 12/05/2012  . Normocytic anemia 12/05/2012  . Hyperglycemia 12/05/2012  . 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, [place], [place],  [address] Phone: [phone]   Fax:  [phone]  Name: Lisa Cabrera MRN: [id number] Date of Birth: [birth year]

## 2016-09-26 ENCOUNTER — Ambulatory Visit: Payer: Medicare Other | Admitting: Physical Therapy

## 2016-09-26 DIAGNOSIS — M25551 Pain in right hip: Secondary | ICD-10-CM | POA: Diagnosis not present

## 2016-09-26 DIAGNOSIS — M25651 Stiffness of right hip, not elsewhere classified: Secondary | ICD-10-CM | POA: Diagnosis not present

## 2016-09-26 DIAGNOSIS — M6281 Muscle weakness (generalized): Secondary | ICD-10-CM | POA: Diagnosis not present

## 2016-09-26 NOTE — Therapy (Signed)
[facility] [address], [place], [place], [address] Phone: [phone]   Fax:  [phone]  Physical Therapy Treatment  Patient Details  Name: Lisa Cabrera MRN: [id number] Date of Birth: [birth year] Referring Provider: Leighton Ruff  Encounter Date: 09/26/2016      PT End of Session - 09/26/16 1449    Visit Number 9   Number of Visits 10   Date for PT Re-Evaluation 09/26/16   PT Start Time 4128   PT Stop Time 1525   PT Time Calculation (min) 40 min   Behavior During Therapy [facility] for tasks assessed/performed      Past Medical History:  Diagnosis Date  . Anemia    normocytic  . Complication of anesthesia    HAD RAPID HEART BEAT AFTER ANESTHIA  . Family history of anesthesia complication    Father had difficulty waking up  . H/O seasonal allergies   . Heart murmur     Past Surgical History:  Procedure Laterality Date  . ABDOMINAL HYSTERECTOMY     1997  . LUMBAR DISC SURGERY     herniated disk  . SPINE SURGERY    . TEE WITHOUT CARDIOVERSION N/A 12/07/2012   Procedure: TRANSESOPHAGEAL ECHOCARDIOGRAM (TEE);  Surgeon: Sanda Klein, MD;  Location: [facility] ENDOSCOPY;  Service: Cardiovascular;  Laterality: N/A;    There were no vitals filed for this visit.      Subjective Assessment - 09/26/16 1457    Subjective I have been going to the [facility] and walking without increaesd pain.  I am feeling a lot better.   Limitations Walking   Patient Stated Goals walk 3x/week 1-2 miles, make sure the pain doesn't come back   Currently in Pain? No/denies                         [facility] Adult PT Treatment/Exercise - 09/26/16 0001      Knee/Hip Exercises: Stretches   Active Hamstring Stretch Left;2 reps;30 seconds   Active Hamstring Stretch Limitations right leg straight for hip flexor stretch  side to side bias   Quad Stretch Right  Supine leg hang   Other Knee/Hip Stretches psoas doorway stretch  with and without UEs     Knee/Hip Exercises: Aerobic   Nustep L2 10 min     Knee/Hip Exercises: Machines for Strengthening   Cybex Leg Press #70 Bil 15x, single leg 35# 15x  Seat 5     Knee/Hip Exercises: Standing   Hip Abduction --   Hip Extension --   Other Standing Knee Exercises hip extension isometric against wall 5x 5x   Other Standing Knee Exercises hip abduction isometric 5x 5 sec hold     Knee/Hip Exercises: Supine   Bridges Strengthening;Both;10 reps   Bridges Limitations with butterfly knees     Knee/Hip Exercises: Sidelying   Clams 2x10   red band                  PT Short Term Goals - 09/26/16 1449      PT SHORT TERM GOAL #1   Title Pt will be independent with initial HEP   Status Achieved     PT SHORT TERM GOAL #2   Title Pt will have 25% less episodes of pain when walking   Status Achieved     PT SHORT TERM GOAL #3   Title Increased hip PROM to 10 degrees of internal rotation and 110 degrees of  flexion for improved function movements such as bending forward and pivoting.   Baseline internal rotation 23 deg; flexion 125 degrees   Status Achieved           PT Long Term Goals - 10/12/16 1447      PT LONG TERM GOAL #1   Title FOTO < or = to 34%   Baseline 21% limited 10-12-2016     PT LONG TERM GOAL #2   Title Pt will have 75% less occurance of pain   Status Achieved     PT LONG TERM GOAL #3   Title pt will be independent with advanced HEP to maintain ROM and strength    Status Achieved     PT LONG TERM GOAL #4   Title Pt will be able to walk 1 mile 3x/week without increased episoded of pain   Status Achieved               Plan - October 12, 2016 1452    Clinical Impression Statement Patient is able to perform advanced HEP and has met goals for safe discharge at this time.     Clinical Impairments Affecting Rehab Potential spinal fusion   PT Treatment/Interventions ADLs/Self Care Home Management;Biofeedback;Cryotherapy;Electrical  Stimulation;Moist Heat;Ultrasound;Gait training;Stair training;Functional mobility training;Therapeutic activities;Therapeutic exercise;Balance training;Neuromuscular re-education;Patient/family education;Manual techniques;Passive range of motion;Dry needling;Taping   PT Next Visit Plan Discharge today   Consulted and Agree with Plan of Care Patient      Patient will benefit from skilled therapeutic intervention in order to improve the following deficits and impairments:  Decreased activity tolerance, Decreased endurance, Decreased range of motion, Decreased strength, Hypomobility, Pain, Postural dysfunction, Increased muscle spasms  Visit Diagnosis: Pain in right hip  Stiffness of right hip, not elsewhere classified  Muscle weakness (generalized)       G-Codes - 12-Oct-2016 1525    Functional Assessment Tool Used (Outpatient Only) FOTO and clinical reasoning   Functional Limitation Mobility: Walking and moving around   Mobility: Walking and Moving Around Current Status [phone]) At least 20 percent but less than 40 percent impaired, limited or restricted   Mobility: Walking and Moving Around Goal Status [phone]) At least 20 percent but less than 40 percent impaired, limited or restricted   Mobility: Walking and Moving Around Discharge Status [phone]) At least 20 percent but less than 40 percent impaired, limited or restricted      Problem List Patient Active Problem List   Diagnosis Date Noted  . Streptococcal bacteremia 12/05/2012  . Normocytic anemia 12/05/2012  . Hyperglycemia 12/05/2012  . 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 [place], [place], [address] Phone: [phone]   Fax:  [phone]  Name: Lisa Cabrera MRN: [id number] Date of Birth: [birth year]  PHYSICAL THERAPY DISCHARGE SUMMARY  Visits from Start of Care: 9  Current functional  level related to goals / functional outcomes: See above goals   Remaining deficits: See above   Education / Equipment: HEP  Plan: Patient agrees to discharge.  Patient goals were met. Patient is being discharged due to meeting the stated rehab goals.  ?????         Google, PT 10/12/2016 3:36 PM

## 2016-10-23 DIAGNOSIS — H2513 Age-related nuclear cataract, bilateral: Secondary | ICD-10-CM | POA: Diagnosis not present

## 2016-10-24 DIAGNOSIS — Z0000 Encounter for general adult medical examination without abnormal findings: Secondary | ICD-10-CM | POA: Diagnosis not present

## 2016-10-24 DIAGNOSIS — E7800 Pure hypercholesterolemia, unspecified: Secondary | ICD-10-CM | POA: Diagnosis not present

## 2016-10-24 DIAGNOSIS — Z131 Encounter for screening for diabetes mellitus: Secondary | ICD-10-CM | POA: Diagnosis not present

## 2016-10-24 DIAGNOSIS — F430 Acute stress reaction: Secondary | ICD-10-CM | POA: Diagnosis not present

## 2016-10-24 DIAGNOSIS — K589 Irritable bowel syndrome without diarrhea: Secondary | ICD-10-CM | POA: Diagnosis not present

## 2016-10-24 DIAGNOSIS — G43909 Migraine, unspecified, not intractable, without status migrainosus: Secondary | ICD-10-CM | POA: Diagnosis not present

## 2016-10-24 DIAGNOSIS — R002 Palpitations: Secondary | ICD-10-CM | POA: Diagnosis not present

## 2016-10-24 DIAGNOSIS — M8580 Other specified disorders of bone density and structure, unspecified site: Secondary | ICD-10-CM | POA: Diagnosis not present

## 2017-01-01 ENCOUNTER — Other Ambulatory Visit: Payer: Self-pay

## 2017-01-01 ENCOUNTER — Ambulatory Visit (HOSPITAL_COMMUNITY): Payer: Medicare Other | Attending: Cardiology

## 2017-01-01 DIAGNOSIS — I341 Nonrheumatic mitral (valve) prolapse: Secondary | ICD-10-CM | POA: Diagnosis not present

## 2017-01-01 DIAGNOSIS — D649 Anemia, unspecified: Secondary | ICD-10-CM | POA: Diagnosis not present

## 2017-01-01 DIAGNOSIS — I081 Rheumatic disorders of both mitral and tricuspid valves: Secondary | ICD-10-CM | POA: Insufficient documentation

## 2017-01-14 NOTE — Progress Notes (Signed)
  Patient ID: Lisa Cabrera, female   DOB: [birth year], 71 y.o.   MRN: [id number] 71 y.o.  seen by Dr Lisa Cabrera  of [facility] September 2014 . 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  All other systems reviewed and negative  General: Affect appropriate Healthy:  appears stated age 71: normal Neck supple with no adenopathy JVP normal no bruits no thyromegaly Lungs clear with no wheezing and good diaphragmatic motion Heart:  S1/S2 mid peaking MR  murmur, no rub, gallop or click PMI normal Abdomen: benighn, BS positve, no tenderness, no AAA no bruit.  No HSM or HJR Distal pulses intact with no bruits No edema Neuro non-focal Skin warm and dry No muscular weakness   Current Outpatient Prescriptions  Medication Sig Dispense Refill  . aspirin 81 MG tablet Take 81 mg by mouth daily.    . calcium gluconate 500 MG tablet Take 2-3 tablets by mouth daily.    . Cholecalciferol (VITAMIN D) 2000 UNITS CAPS Take 1 capsule by mouth daily.     Marland Kitchen doxepin (SINEQUAN) 25 MG capsule Take 25 mg by mouth daily.  4  . metroNIDAZOLE (METROGEL) 1 % gel Apply 1 application topically daily. ROSACEA    . Multiple Vitamins-Minerals (MULTIVITAMIN PO) Take 1 tablet by mouth daily.     . Omega-3 Fatty Acids (FISH OIL PO) Take 1 tablet by mouth daily.     . verapamil (VERELAN PM) 240 MG 24 hr capsule Take 240 mg by mouth daily.     No current facility-administered medications for this visit.     Allergies  Patient has no known  allergies.  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  Continue current medications and low sodium Dash type diet.   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

## 2017-01-20 DIAGNOSIS — Z23 Encounter for immunization: Secondary | ICD-10-CM | POA: Diagnosis not present

## 2017-01-22 ENCOUNTER — Ambulatory Visit (INDEPENDENT_AMBULATORY_CARE_PROVIDER_SITE_OTHER): Payer: Medicare Other | Admitting: Cardiovascular Disease

## 2017-01-22 VITALS — BP 128/72 | HR 86 | Ht 61.5 in | Wt 125.0 lb

## 2017-01-22 DIAGNOSIS — I341 Nonrheumatic mitral (valve) prolapse: Secondary | ICD-10-CM

## 2017-01-22 DIAGNOSIS — I10 Essential (primary) hypertension: Secondary | ICD-10-CM

## 2017-01-22 NOTE — Patient Instructions (Addendum)

## 2017-02-03 DIAGNOSIS — M8589 Other specified disorders of bone density and structure, multiple sites: Secondary | ICD-10-CM | POA: Diagnosis not present

## 2017-02-03 DIAGNOSIS — Z1231 Encounter for screening mammogram for malignant neoplasm of breast: Secondary | ICD-10-CM | POA: Diagnosis not present

## 2017-05-26 DIAGNOSIS — L570 Actinic keratosis: Secondary | ICD-10-CM | POA: Diagnosis not present

## 2017-05-26 DIAGNOSIS — L821 Other seborrheic keratosis: Secondary | ICD-10-CM | POA: Diagnosis not present

## 2017-05-26 DIAGNOSIS — D225 Melanocytic nevi of trunk: Secondary | ICD-10-CM | POA: Diagnosis not present

## 2017-05-26 DIAGNOSIS — D1800 Hemangioma unspecified site: Secondary | ICD-10-CM | POA: Diagnosis not present

## 2017-05-26 DIAGNOSIS — L719 Rosacea, unspecified: Secondary | ICD-10-CM | POA: Diagnosis not present

## 2017-05-26 DIAGNOSIS — Z23 Encounter for immunization: Secondary | ICD-10-CM | POA: Diagnosis not present

## 2017-05-26 DIAGNOSIS — L814 Other melanin hyperpigmentation: Secondary | ICD-10-CM | POA: Diagnosis not present

## 2017-08-07 DIAGNOSIS — J0100 Acute maxillary sinusitis, unspecified: Secondary | ICD-10-CM | POA: Diagnosis not present

## 2017-11-13 DIAGNOSIS — E7800 Pure hypercholesterolemia, unspecified: Secondary | ICD-10-CM | POA: Diagnosis not present

## 2017-11-13 DIAGNOSIS — N183 Chronic kidney disease, stage 3 (moderate): Secondary | ICD-10-CM | POA: Diagnosis not present

## 2017-11-13 DIAGNOSIS — Z131 Encounter for screening for diabetes mellitus: Secondary | ICD-10-CM | POA: Diagnosis not present

## 2017-11-17 DIAGNOSIS — E7800 Pure hypercholesterolemia, unspecified: Secondary | ICD-10-CM | POA: Diagnosis not present

## 2017-11-17 DIAGNOSIS — Z1159 Encounter for screening for other viral diseases: Secondary | ICD-10-CM | POA: Diagnosis not present

## 2017-11-17 DIAGNOSIS — K589 Irritable bowel syndrome without diarrhea: Secondary | ICD-10-CM | POA: Diagnosis not present

## 2017-11-17 DIAGNOSIS — Z1211 Encounter for screening for malignant neoplasm of colon: Secondary | ICD-10-CM | POA: Diagnosis not present

## 2017-11-17 DIAGNOSIS — Z1389 Encounter for screening for other disorder: Secondary | ICD-10-CM | POA: Diagnosis not present

## 2017-11-17 DIAGNOSIS — M8580 Other specified disorders of bone density and structure, unspecified site: Secondary | ICD-10-CM | POA: Diagnosis not present

## 2017-11-17 DIAGNOSIS — F430 Acute stress reaction: Secondary | ICD-10-CM | POA: Diagnosis not present

## 2017-11-17 DIAGNOSIS — Z0000 Encounter for general adult medical examination without abnormal findings: Secondary | ICD-10-CM | POA: Diagnosis not present

## 2017-11-17 DIAGNOSIS — N183 Chronic kidney disease, stage 3 (moderate): Secondary | ICD-10-CM | POA: Diagnosis not present

## 2017-11-17 DIAGNOSIS — G43909 Migraine, unspecified, not intractable, without status migrainosus: Secondary | ICD-10-CM | POA: Diagnosis not present

## 2017-11-17 DIAGNOSIS — M419 Scoliosis, unspecified: Secondary | ICD-10-CM | POA: Diagnosis not present

## 2017-11-26 DIAGNOSIS — H2513 Age-related nuclear cataract, bilateral: Secondary | ICD-10-CM | POA: Diagnosis not present

## 2017-12-17 ENCOUNTER — Ambulatory Visit (HOSPITAL_COMMUNITY): Payer: Medicare Other | Attending: Cardiovascular Disease

## 2017-12-17 ENCOUNTER — Other Ambulatory Visit: Payer: Self-pay

## 2017-12-17 ENCOUNTER — Other Ambulatory Visit: Payer: Self-pay | Admitting: Cardiovascular Disease

## 2017-12-17 DIAGNOSIS — I119 Hypertensive heart disease without heart failure: Secondary | ICD-10-CM | POA: Insufficient documentation

## 2017-12-17 DIAGNOSIS — I341 Nonrheumatic mitral (valve) prolapse: Secondary | ICD-10-CM | POA: Diagnosis not present

## 2017-12-17 DIAGNOSIS — I340 Nonrheumatic mitral (valve) insufficiency: Secondary | ICD-10-CM

## 2017-12-25 DIAGNOSIS — Z23 Encounter for immunization: Secondary | ICD-10-CM | POA: Diagnosis not present

## 2018-01-30 NOTE — Progress Notes (Signed)
  Patient ID: Lisa Cabrera, female   DOB: 05-04-1945, 72 y.o.   MRN: [id number]     72 y.o.  seen by Dr Sallyanne Kuster  of [facility] September 2014 . Had been ill since dental appt Had Strep bacteremia and Rx for presumed SBE with 4 weeks of iv antibiotics   Echo reviewed 12/17/17 EF 65-70% normal LV size Anterior prolapse mild MR Normal LA Estimated PA 22 mmHg   She has easy bruising and had a transient fever with flu shot Otherwise doing well  ROS: Denies fever, malais, weight loss, blurry vision, decreased visual acuity, cough, sputum, SOB, hemoptysis, pleuritic pain, palpitaitons, heartburn, abdominal pain, melena, lower extremity edema, claudication, or rash.  All other systems reviewed and negative  General: BP (!) 154/74   Pulse (!) 59   Ht 5' 1.5" (1.562 m)   Wt 125 lb 4 oz (56.8 kg)   SpO2 99%   BMI 23.28 kg/m  Affect appropriate Healthy:  appears stated age 9: normal Neck supple with no adenopathy JVP normal no bruits no thyromegaly Lungs clear with no wheezing and good diaphragmatic motion Heart:  V4/B4 mid systolic ejection murmur of MVP/MR no MS murmur, no rub, gallop or click PMI normal Abdomen: benighn, BS positve, no tenderness, no AAA no bruit.  No HSM or HJR Distal pulses intact with no bruits No edema Neuro non-focal Skin warm and dry No muscular weakness    Current Outpatient Medications  Medication Sig Dispense Refill  . ALPRAZolam (XANAX) 0.5 MG tablet Take 0.5 mg by mouth at bedtime as needed for anxiety.    Marland Kitchen aspirin 81 MG tablet Take 81 mg by mouth daily.    . calcium gluconate 500 MG tablet Take 2-3 tablets by mouth daily.    . Cholecalciferol (VITAMIN D) 2000 UNITS CAPS Take 1 capsule by mouth daily.     Marland Kitchen doxepin (SINEQUAN) 25 MG capsule Take 25 mg by mouth daily.  4  . metroNIDAZOLE (METROGEL) 1 % gel Apply 1 application topically daily. ROSACEA    . Multiple Vitamins-Minerals (MULTIVITAMIN PO) Take 1 tablet by mouth daily.     . Omega-3  Fatty Acids (FISH OIL PO) Take 1 tablet by mouth daily.     . verapamil (VERELAN PM) 240 MG 24 hr capsule Take 240 mg by mouth daily.     No current facility-administered medications for this visit.     Allergies  Patient has no known allergies.  Electrocardiogram:  02/09/18  SR rate 80 normal ECG   Assessment and Plan HTN:  Well controlled.  Continue current medications and low sodium Dash type diet.   MVP/MR:  Stable mild  MR by echo  September 2019 compensated LV without enlargement  SBE: previous with preexisting MVP  Resolved SBE prophylaxis life long  [name]

## 2018-02-04 DIAGNOSIS — Z1231 Encounter for screening mammogram for malignant neoplasm of breast: Secondary | ICD-10-CM | POA: Diagnosis not present

## 2018-02-09 ENCOUNTER — Encounter: Payer: Self-pay | Admitting: Cardiovascular Disease

## 2018-02-09 ENCOUNTER — Ambulatory Visit (INDEPENDENT_AMBULATORY_CARE_PROVIDER_SITE_OTHER): Payer: Medicare Other | Admitting: Cardiovascular Disease

## 2018-02-09 VITALS — BP 154/74 | HR 59 | Ht 61.5 in | Wt 125.2 lb

## 2018-02-09 DIAGNOSIS — I10 Essential (primary) hypertension: Secondary | ICD-10-CM

## 2018-02-09 NOTE — Patient Instructions (Signed)

## 2018-05-18 DIAGNOSIS — R29810 Facial weakness: Secondary | ICD-10-CM | POA: Diagnosis not present

## 2018-05-18 DIAGNOSIS — R0981 Nasal congestion: Secondary | ICD-10-CM | POA: Diagnosis not present

## 2018-05-18 DIAGNOSIS — R05 Cough: Secondary | ICD-10-CM | POA: Diagnosis not present

## 2018-05-18 DIAGNOSIS — Q670 Congenital facial asymmetry: Secondary | ICD-10-CM | POA: Diagnosis not present

## 2018-05-21 ENCOUNTER — Other Ambulatory Visit: Payer: Self-pay | Admitting: Family Medicine

## 2018-05-21 DIAGNOSIS — R29810 Facial weakness: Secondary | ICD-10-CM

## 2018-05-25 DIAGNOSIS — L821 Other seborrheic keratosis: Secondary | ICD-10-CM | POA: Diagnosis not present

## 2018-05-25 DIAGNOSIS — D485 Neoplasm of uncertain behavior of skin: Secondary | ICD-10-CM | POA: Diagnosis not present

## 2018-05-25 DIAGNOSIS — D225 Melanocytic nevi of trunk: Secondary | ICD-10-CM | POA: Diagnosis not present

## 2018-05-25 DIAGNOSIS — K130 Diseases of lips: Secondary | ICD-10-CM | POA: Diagnosis not present

## 2018-05-25 DIAGNOSIS — L729 Follicular cyst of the skin and subcutaneous tissue, unspecified: Secondary | ICD-10-CM | POA: Diagnosis not present

## 2018-05-25 DIAGNOSIS — L719 Rosacea, unspecified: Secondary | ICD-10-CM | POA: Diagnosis not present

## 2018-05-25 DIAGNOSIS — L308 Other specified dermatitis: Secondary | ICD-10-CM | POA: Diagnosis not present

## 2018-05-25 DIAGNOSIS — L814 Other melanin hyperpigmentation: Secondary | ICD-10-CM | POA: Diagnosis not present

## 2018-05-25 DIAGNOSIS — Z23 Encounter for immunization: Secondary | ICD-10-CM | POA: Diagnosis not present

## 2018-05-27 DIAGNOSIS — H2513 Age-related nuclear cataract, bilateral: Secondary | ICD-10-CM | POA: Diagnosis not present

## 2018-05-28 ENCOUNTER — Ambulatory Visit
Admission: RE | Admit: 2018-05-28 | Discharge: 2018-05-28 | Disposition: A | Discharge status: Home / Self Care | Payer: Medicare Other | Source: Ambulatory Visit | Attending: Family Medicine | Admitting: Family Medicine

## 2018-05-28 DIAGNOSIS — R29810 Facial weakness: Secondary | ICD-10-CM

## 2018-07-07 DIAGNOSIS — E7800 Pure hypercholesterolemia, unspecified: Secondary | ICD-10-CM | POA: Diagnosis not present

## 2018-07-07 DIAGNOSIS — Z79899 Other long term (current) drug therapy: Secondary | ICD-10-CM | POA: Diagnosis not present

## 2018-11-27 DIAGNOSIS — F430 Acute stress reaction: Secondary | ICD-10-CM | POA: Diagnosis not present

## 2018-11-27 DIAGNOSIS — K589 Irritable bowel syndrome without diarrhea: Secondary | ICD-10-CM | POA: Diagnosis not present

## 2018-11-27 DIAGNOSIS — N183 Chronic kidney disease, stage 3 (moderate): Secondary | ICD-10-CM | POA: Diagnosis not present

## 2018-11-27 DIAGNOSIS — G43909 Migraine, unspecified, not intractable, without status migrainosus: Secondary | ICD-10-CM | POA: Diagnosis not present

## 2018-11-27 DIAGNOSIS — M6530 Trigger finger, unspecified finger: Secondary | ICD-10-CM | POA: Diagnosis not present

## 2018-11-27 DIAGNOSIS — Z0000 Encounter for general adult medical examination without abnormal findings: Secondary | ICD-10-CM | POA: Diagnosis not present

## 2018-11-27 DIAGNOSIS — E7800 Pure hypercholesterolemia, unspecified: Secondary | ICD-10-CM | POA: Diagnosis not present

## 2018-11-28 DIAGNOSIS — Z23 Encounter for immunization: Secondary | ICD-10-CM | POA: Diagnosis not present

## 2018-12-18 DIAGNOSIS — Z79899 Other long term (current) drug therapy: Secondary | ICD-10-CM | POA: Diagnosis not present

## 2018-12-18 DIAGNOSIS — G43909 Migraine, unspecified, not intractable, without status migrainosus: Secondary | ICD-10-CM | POA: Diagnosis not present

## 2018-12-18 DIAGNOSIS — M8580 Other specified disorders of bone density and structure, unspecified site: Secondary | ICD-10-CM | POA: Diagnosis not present

## 2018-12-18 DIAGNOSIS — Z1211 Encounter for screening for malignant neoplasm of colon: Secondary | ICD-10-CM | POA: Diagnosis not present

## 2018-12-18 DIAGNOSIS — K589 Irritable bowel syndrome without diarrhea: Secondary | ICD-10-CM | POA: Diagnosis not present

## 2018-12-18 DIAGNOSIS — N183 Chronic kidney disease, stage 3 (moderate): Secondary | ICD-10-CM | POA: Diagnosis not present

## 2018-12-18 DIAGNOSIS — M419 Scoliosis, unspecified: Secondary | ICD-10-CM | POA: Diagnosis not present

## 2018-12-18 DIAGNOSIS — Z1159 Encounter for screening for other viral diseases: Secondary | ICD-10-CM | POA: Diagnosis not present

## 2018-12-18 DIAGNOSIS — F430 Acute stress reaction: Secondary | ICD-10-CM | POA: Diagnosis not present

## 2018-12-18 DIAGNOSIS — E7800 Pure hypercholesterolemia, unspecified: Secondary | ICD-10-CM | POA: Diagnosis not present

## 2018-12-22 DIAGNOSIS — H5213 Myopia, bilateral: Secondary | ICD-10-CM | POA: Diagnosis not present

## 2018-12-22 DIAGNOSIS — H52203 Unspecified astigmatism, bilateral: Secondary | ICD-10-CM | POA: Diagnosis not present

## 2018-12-22 DIAGNOSIS — H524 Presbyopia: Secondary | ICD-10-CM | POA: Diagnosis not present

## 2018-12-22 DIAGNOSIS — H2513 Age-related nuclear cataract, bilateral: Secondary | ICD-10-CM | POA: Diagnosis not present

## 2019-02-05 NOTE — Progress Notes (Signed)
  Patient ID: Lisa Cabrera, female   DOB: 1945-09-13, 73 y.o.   MRN: [id number]     73 y.o.  seen by Dr Sallyanne Kuster  of [facility] September 2014 . Had been ill since dental appt Had Strep bacteremia and Rx for presumed SBE with 4 weeks of iv antibiotics   Echo reviewed 12/17/17 EF 65-70% normal LV size Anterior prolapse mild MR Normal LA Estimated PA 22 mmHg   She has easy bruising and had a transient fever with flu shot Otherwise doing well  No cardiac complaints BP runs better at home   ROS: Denies fever, malais, weight loss, blurry vision, decreased visual acuity, cough, sputum, SOB, hemoptysis, pleuritic pain, palpitaitons, heartburn, abdominal pain, melena, lower extremity edema, claudication, or rash.  All other systems reviewed and negative  General: BP (!) 150/64   Pulse 73   Ht 5' 1.5" (1.562 m)   Wt 127 lb (57.6 kg)   SpO2 98%   BMI 23.61 kg/m  Affect appropriate Healthy:  appears stated age 73: normal Neck supple with no adenopathy JVP normal no bruits no thyromegaly Lungs clear with no wheezing and good diaphragmatic motion Heart:  [id number] mid systolic ejection murmur of MVP/MR no MS murmur, no rub, gallop or click PMI normal Abdomen: benighn, BS positve, no tenderness, no AAA no bruit.  No HSM or HJR Distal pulses intact with no bruits No edema Neuro non-focal Skin warm and dry No muscular weakness    Current Outpatient Medications  Medication Sig Dispense Refill  . ALPRAZolam (XANAX) 0.5 MG tablet Take 0.5 mg by mouth at bedtime as needed for anxiety.    Marland Kitchen aspirin 81 MG tablet Take 81 mg by mouth daily.    . calcium gluconate 500 MG tablet Take 2-3 tablets by mouth daily.    . Cholecalciferol (VITAMIN D) 2000 UNITS CAPS Take 1 capsule by mouth daily.     Marland Kitchen doxepin (SINEQUAN) 25 MG capsule Take 25 mg by mouth daily.  4  . metroNIDAZOLE (METROGEL) 1 % gel Apply 1 application topically daily. ROSACEA    . Multiple Vitamins-Minerals (MULTIVITAMIN PO) Take 1  tablet by mouth daily.     . Omega-3 Fatty Acids (FISH OIL PO) Take 1 tablet by mouth daily.     . verapamil (VERELAN PM) 240 MG 24 hr capsule Take 240 mg by mouth daily.    . pravastatin (PRAVACHOL) 20 MG tablet Take 20 mg by mouth daily.     No current facility-administered medications for this visit.     Allergies  Patient has no known allergies.  Electrocardiogram:  02/09/18  SR rate 80 normal ECG 02/10/20 SR rate 73 normal   Assessment and Plan HTN:  Well controlled.  Continue current medications and low sodium Dash type diet.  BP better at home   MVP/MR:  Stable mild  MR by echo  September 2019 compensated LV without enlargement  SBE: previous with preexisting MVP  Resolved SBE prophylaxis life long Will order f/u echo for September 2021   [name]

## 2019-02-10 ENCOUNTER — Ambulatory Visit (INDEPENDENT_AMBULATORY_CARE_PROVIDER_SITE_OTHER): Payer: Medicare Other | Admitting: Cardiovascular Disease

## 2019-02-10 ENCOUNTER — Other Ambulatory Visit: Payer: Self-pay

## 2019-02-10 ENCOUNTER — Encounter: Payer: Self-pay | Admitting: Cardiovascular Disease

## 2019-02-10 VITALS — BP 150/64 | HR 73 | Ht 61.5 in | Wt 127.0 lb

## 2019-02-10 DIAGNOSIS — I341 Nonrheumatic mitral (valve) prolapse: Secondary | ICD-10-CM | POA: Diagnosis not present

## 2019-02-10 DIAGNOSIS — I340 Nonrheumatic mitral (valve) insufficiency: Secondary | ICD-10-CM

## 2019-02-10 DIAGNOSIS — Z1231 Encounter for screening mammogram for malignant neoplasm of breast: Secondary | ICD-10-CM | POA: Diagnosis not present

## 2019-02-10 NOTE — Patient Instructions (Addendum)
Medication Instructions:   *If you need a refill on your cardiac medications before your next appointment, please call your pharmacy*  Lab Work:  If you have labs (blood work) drawn today and your tests are completely normal, you will receive your results only by: Marland Kitchen MyChart Message (if you have MyChart) OR . A paper copy in the mail If you have any lab test that is abnormal or we need to change your treatment, we will call you to review the results.  Testing/Procedures: Your physician has requested that you have an echocardiogram in September 2021. Echocardiography is a painless test that uses sound waves to create images of your heart. It provides your doctor with information about the size and shape of your heart and how well your heart's chambers and valves are working. This procedure takes approximately one hour. There are no restrictions for this procedure.  Follow-Up: At [facility], you and your health needs are our priority.  As part of our continuing mission to provide you with exceptional heart care, we have created designated Provider Care Teams.  These Care Teams include your primary Cardiologist (physician) and Advanced Practice Providers (APPs -  Physician Assistants and Nurse Practitioners) who all work together to provide you with the care you need, when you need it.  Your next appointment:   September 2021 with echo same day  The format for your next appointment:   In Person  Provider:   You may see [name], MD or one of the following Advanced Practice Providers on your designated Care Team:    Truitt Merle, NP  Cecilie Kicks, NP  Kathyrn Drown, NP

## 2019-04-28 ENCOUNTER — Ambulatory Visit: Payer: Medicare Other

## 2019-05-07 ENCOUNTER — Ambulatory Visit: Payer: Medicare Other | Attending: Internal Medicine

## 2019-05-07 DIAGNOSIS — Z23 Encounter for immunization: Secondary | ICD-10-CM | POA: Insufficient documentation

## 2019-05-07 NOTE — Progress Notes (Signed)
   Covid-19 Vaccination Clinic  Name:  HALA IVORY    MRN: [id number] DOB: 1945/08/03  05/07/2019  Ms. Fontan was observed post Covid-19 immunization for 15 minutes without incidence. She was provided with Vaccine Information Sheet and instruction to access the V-Safe system.   Ms. Faunce was instructed to call 911 with any severe reactions post vaccine: Marland Kitchen Difficulty breathing  . Swelling of your face and throat  . A fast heartbeat  . A bad rash all over your body  . Dizziness and weakness    Immunizations Administered    Name Date Dose VIS Date Route   Pfizer COVID-19 Vaccine 05/07/2019  8:39 AM 0.3 mL 03/12/2019 Intramuscular   Manufacturer: [place]   Lot: [id number]   [place]: [id number]

## 2019-05-14 DIAGNOSIS — M8580 Other specified disorders of bone density and structure, unspecified site: Secondary | ICD-10-CM | POA: Diagnosis not present

## 2019-05-14 DIAGNOSIS — E7800 Pure hypercholesterolemia, unspecified: Secondary | ICD-10-CM | POA: Diagnosis not present

## 2019-05-15 ENCOUNTER — Ambulatory Visit: Payer: Medicare Other

## 2019-05-31 DIAGNOSIS — L814 Other melanin hyperpigmentation: Secondary | ICD-10-CM | POA: Diagnosis not present

## 2019-05-31 DIAGNOSIS — D224 Melanocytic nevi of scalp and neck: Secondary | ICD-10-CM | POA: Diagnosis not present

## 2019-05-31 DIAGNOSIS — D225 Melanocytic nevi of trunk: Secondary | ICD-10-CM | POA: Diagnosis not present

## 2019-05-31 DIAGNOSIS — Z23 Encounter for immunization: Secondary | ICD-10-CM | POA: Diagnosis not present

## 2019-05-31 DIAGNOSIS — L821 Other seborrheic keratosis: Secondary | ICD-10-CM | POA: Diagnosis not present

## 2019-05-31 DIAGNOSIS — L578 Other skin changes due to chronic exposure to nonionizing radiation: Secondary | ICD-10-CM | POA: Diagnosis not present

## 2019-06-01 ENCOUNTER — Ambulatory Visit: Payer: Medicare Other | Attending: Internal Medicine

## 2019-06-01 DIAGNOSIS — Z23 Encounter for immunization: Secondary | ICD-10-CM | POA: Insufficient documentation

## 2019-06-01 NOTE — Progress Notes (Signed)
   Covid-19 Vaccination Clinic  Name:  TANERA BAUKNIGHT    MRN: [id number] DOB: 05-31-1945  06/01/2019  Ms. Elsaesser was observed post Covid-19 immunization for 15 minutes without incident. She was provided with Vaccine Information Sheet and instruction to access the V-Safe system.   Ms. Rasche was instructed to call 911 with any severe reactions post vaccine: Marland Kitchen Difficulty breathing  . Swelling of face and throat  . A fast heartbeat  . A bad rash all over body  . Dizziness and weakness   Immunizations Administered    Name Date Dose VIS Date Route   Pfizer COVID-19 Vaccine 06/01/2019  9:17 AM 0.3 mL 03/12/2019 Intramuscular   Manufacturer: [place]   Lot: [id number]   [place]: [id number]

## 2019-06-22 DIAGNOSIS — H2513 Age-related nuclear cataract, bilateral: Secondary | ICD-10-CM | POA: Diagnosis not present

## 2019-06-22 DIAGNOSIS — H5213 Myopia, bilateral: Secondary | ICD-10-CM | POA: Diagnosis not present

## 2019-06-22 DIAGNOSIS — H524 Presbyopia: Secondary | ICD-10-CM | POA: Diagnosis not present

## 2019-07-28 DIAGNOSIS — M1611 Unilateral primary osteoarthritis, right hip: Secondary | ICD-10-CM | POA: Diagnosis not present

## 2019-07-28 DIAGNOSIS — M25551 Pain in right hip: Secondary | ICD-10-CM | POA: Diagnosis not present

## 2019-08-03 ENCOUNTER — Other Ambulatory Visit: Payer: Self-pay

## 2019-08-03 ENCOUNTER — Ambulatory Visit (INDEPENDENT_AMBULATORY_CARE_PROVIDER_SITE_OTHER): Payer: Medicare Other | Admitting: Orthopaedic Surgery

## 2019-08-03 ENCOUNTER — Ambulatory Visit (INDEPENDENT_AMBULATORY_CARE_PROVIDER_SITE_OTHER): Payer: Medicare Other

## 2019-08-03 VITALS — Ht 61.0 in | Wt 119.0 lb

## 2019-08-03 DIAGNOSIS — M1611 Unilateral primary osteoarthritis, right hip: Secondary | ICD-10-CM | POA: Insufficient documentation

## 2019-08-03 DIAGNOSIS — M25551 Pain in right hip: Secondary | ICD-10-CM | POA: Diagnosis not present

## 2019-08-03 NOTE — Progress Notes (Signed)
Office Visit Note   Patient: Lisa Cabrera           Date of Birth: 15-Sep-1945           MRN: [id number] Visit Date: 08/03/2019              Requested by: Lawerance Cruel, [place],  [place] [address]   PCP: Lawerance Cruel, MD   Assessment & Plan: Visit Diagnoses:  1. Pain in right hip   2. Unilateral primary osteoarthritis, right hip     Plan: I did go over x-rays in detail and told her about the severity of her arthritis with the right hip.  Given the fact that her x-ray findings show end-stage bone-on-bone arthritis and this is hurting her on a daily basis as well as detrimentally affecting her quality of life, her mobility and actives daily living, she would like to consider hip replacement surgery.  I did give her a copy of her x-rays and went over the surgery in detail.  I discussed the interoperative and postoperative course as well as the risk and benefits of surgery.  I did give her our surgery scheduler's card.  I also gave her a printout of her x-rays.  She would like to discuss this with her family.  She is welcome to call me anytime to discuss this further over the phone versus going ahead and setting up surgery when she is ready.  All questions and concerns were answered and addressed.  Follow-Up Instructions: Return if symptoms worsen or fail to improve.   Orders:  Orders Placed This Encounter  Procedures  . XR HIP UNILAT W OR W/O PELVIS 1V RIGHT   No orders of the defined types were placed in this encounter.     Procedures: No procedures performed   Clinical Data: No additional findings.   Subjective: Chief Complaint  Patient presents with  . Right Hip - Pain  The patient is a very pleasant 74 year old female who is referred from Dr. Harrington Challenger to evaluate and treat right hip pain is been getting worse for over a year now.  She states that it hurts in the groin.  It is gotten worse the last few months and the last few weeks.  She  says it mainly hurts with walking.  If she has been sitting for a while and gets up to walk she has severe pain in her groin and she is able to walk with soft some.  At this point her daily pain can be 10 out of 10 at times.  Her right hip pain is now detrimentally affecting her mobility, her quality of life and her actives of daily living.  She is a thin individual.  She is not a diabetic.  She does not smoke.  She does take a baby aspirin daily.  She denies any acute changes in her medical status.  HPI  Review of Systems She currently denies any headache, chest pain, shortness of breath, fever, chills, nausea, vomiting  Objective: Vital Signs: Ht 5\' 1"  (1.549 m)   Wt 119 lb (54 kg)   BMI 22.48 kg/m   Physical Exam She is alert and orient x3 and in no acute distress Ortho Exam Examination of her left hip shows fluid and full range of motion with no pain in the groin.  Examination of her right painful hip shows significant limitations with internal and external rotation.  There is also severe pain in the groin  when I attempt to rotate the hip.  The remainder of her lower extremity exam is normal. Specialty Comments:  No specialty comments available.  Imaging: XR HIP UNILAT W OR W/O PELVIS 1V RIGHT  Result Date: 08/03/2019 A low AP pelvis lateral the right hip shows severe end-stage arthritis of the right hip.  The joint space is completely gone.  There are periarticular osteophytes.  There is also sclerotic changes in the femoral head and acetabulum as well as cystic changes.  This is bone-on-bone end-stage arthritis.    PMFS History: Patient Active Problem List   Diagnosis Date Noted  . Unilateral primary osteoarthritis, right hip 08/03/2019  . Streptococcal bacteremia 12/05/2012  . Normocytic anemia 12/05/2012  . Hyperglycemia 12/05/2012  . 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 Anemia    normocytic  . Complication of anesthesia    HAD  RAPID HEART BEAT AFTER ANESTHIA  . Family history of anesthesia complication    Father had difficulty waking up  . H/O seasonal allergies   . Heart murmur     Family History  Problem Relation Age of Onset  . Dementia Mother   . Heart attack Mother   . Stroke Father   . Kidney disease Father   . Cancer Sister        ovarian    Past Surgical History:  Procedure Laterality Date  . ABDOMINAL HYSTERECTOMY     1997  . LUMBAR DISC SURGERY     herniated disk  . SPINE SURGERY    . TEE WITHOUT CARDIOVERSION N/A 12/07/2012   Procedure: TRANSESOPHAGEAL ECHOCARDIOGRAM (TEE);  Surgeon: Sanda Klein, MD;  Location: [facility] ENDOSCOPY;  Service: Cardiovascular;  Laterality: N/A;   Social History   Occupational History  . Occupation: retired  Tobacco Use  . Smoking status: Never Smoker  . Smokeless tobacco: Never Used  Substance and Sexual Activity  . Alcohol use: Yes    Comment: occasionally  . Drug use: No  . Sexual activity: Yes

## 2019-08-13 ENCOUNTER — Other Ambulatory Visit: Payer: Self-pay

## 2019-08-25 NOTE — Patient Instructions (Addendum)
DUE TO COVID-19 ONLY ONE VISITOR IS ALLOWED TO COME WITH YOU AND STAY IN THE WAITING ROOM ONLY DURING PRE OP AND PROCEDURE DAY OF SURGERY. THE 1 VISITOR MAY VISIT WITH YOU AFTER SURGERY IN YOUR PRIVATE ROOM DURING VISITING HOURS ONLY!  YOU NEED TO HAVE A COVID 19 TEST ON 09-07-18 @ 2:55 PM, THIS TEST MUST BE DONE BEFORE SURGERY, COME  [address], [place] , [address].  ([place]) ONCE YOUR COVID TEST IS COMPLETED, PLEASE BEGIN THE QUARANTINE INSTRUCTIONS AS OUTLINED IN YOUR HANDOUT.                Lisa Cabrera  08/25/2019   Your procedure is scheduled on: 09-10-19    Report to [facility] Main  Entrance    Report to Admitting at  8:30 AM     Call this number if you have problems the morning of surgery [phone]    Remember: NO SOLID FOOD AFTER MIDNIGHT THE NIGHT PRIOR TO SURGERY. NOTHING BY MOUTH EXCEPT CLEAR LIQUIDS UNTIL 8:00 AM . PLEASE FINISH ENSURE DRINK PER SURGEON ORDER  WHICH NEEDS TO BE COMPLETED AT 8:00 AM .    CLEAR LIQUID DIET   Foods Allowed                                                                     Foods Excluded  Coffee and tea, regular and decaf                             liquids that you cannot  Plain Jell-O any favor except red or purple                                           see through such as: Fruit ices (not with fruit pulp)                                     milk, soups, orange juice  Iced Popsicles                                    All solid food Carbonated beverages, regular and diet                                    Cranberry, grape and apple juices Sports drinks like Gatorade Lightly seasoned clear broth or consume(fat free) Sugar, honey syrup  Sample Menu Breakfast                                Lunch                                     Supper Cranberry juice  Beef broth                            Chicken broth Jell-O                                     Grape juice                            Apple juice Coffee or tea                        Jell-O                                      Popsicle                                                Coffee or tea                        Coffee or tea  _____________________________________________________________________    Take these medicines the morning of surgery with A SIP OF WATER: None  BRUSH YOUR TEETH MORNING OF SURGERY AND RINSE YOUR MOUTH OUT, NO CHEWING GUM CANDY OR MINTS.                                You may not have any metal on your body including hair pins and              piercings     Do not wear jewelry, make-up, lotions, powders or perfumes, deodorant              Do not wear nail polish on your fingernails.  Do not shave  48 hours prior to surgery.              Do not bring valuables to the hospital. [place].  Contacts, dentures or bridgework may not be worn into surgery.  You may bring an overnight bag   Special Instructions: N/A              Please read over the following fact sheets you were given: _____________________________________________________________________             [facility] - Preparing for Surgery Before surgery, you can play an important role.  Because skin is not sterile, your skin needs to be as free of germs as possible.  You can reduce the number of germs on your skin by washing with CHG (chlorahexidine gluconate) soap before surgery.  CHG is an antiseptic cleaner which kills germs and bonds with the skin to continue killing germs even after washing. Please DO NOT use if you have an allergy to CHG or antibacterial soaps.  If your skin becomes reddened/irritated stop using the CHG and inform your nurse when you arrive at Short Stay. Do not shave (including legs and underarms) for at least 48 hours prior to the  first CHG shower.  You may shave your face/neck. Please follow these instructions carefully:  1.  Shower with  CHG Soap the night before surgery and the  morning of Surgery.  2.  If you choose to wash your hair, wash your hair first as usual with your  normal  shampoo.  3.  After you shampoo, rinse your hair and body thoroughly to remove the  shampoo.                           4.  Use CHG as you would any other liquid soap.  You can apply chg directly  to the skin and wash                       Gently with a scrungie or clean washcloth.  5.  Apply the CHG Soap to your body ONLY FROM THE NECK DOWN.   Do not use on face/ open                           Wound or open sores. Avoid contact with eyes, ears mouth and genitals (private parts).                       Wash face,  Genitals (private parts) with your normal soap.             6.  Wash thoroughly, paying special attention to the area where your surgery  will be performed.  7.  Thoroughly rinse your body with warm water from the neck down.  8.  DO NOT shower/wash with your normal soap after using and rinsing off  the CHG Soap.                9.  Pat yourself dry with a clean towel.            10.  Wear clean pajamas.            11.  Place clean sheets on your bed the night of your first shower and do not  sleep with pets. Day of Surgery : Do not apply any lotions/deodorants the morning of surgery.  Please wear clean clothes to the hospital/surgery center.  FAILURE TO FOLLOW THESE INSTRUCTIONS MAY RESULT IN THE CANCELLATION OF YOUR SURGERY PATIENT SIGNATURE_________________________________  NURSE SIGNATURE__________________________________  ________________________________________________________________________   Lisa Cabrera  An incentive spirometer is a tool that can help keep your lungs clear and active. This tool measures how well you are filling your lungs with each breath. Taking long deep breaths may help reverse or decrease the chance of developing breathing (pulmonary) problems (especially infection) following:  A long period of  time when you are unable to move or be active. BEFORE THE PROCEDURE   If the spirometer includes an indicator to show your best effort, your nurse or respiratory therapist will set it to a desired goal.  If possible, sit up straight or lean slightly forward. Try not to slouch.  Hold the incentive spirometer in an upright position. INSTRUCTIONS FOR USE  1. Sit on the edge of your bed if possible, or sit up as far as you can in bed or on a chair. 2. Hold the incentive spirometer in an upright position. 3. Breathe out normally. 4. Place the mouthpiece in your mouth and seal your lips tightly around it. 5. Breathe in slowly and  as deeply as possible, raising the piston or the ball toward the top of the column. 6. Hold your breath for 3-5 seconds or for as long as possible. Allow the piston or ball to fall to the bottom of the column. 7. Remove the mouthpiece from your mouth and breathe out normally. 8. Rest for a few seconds and repeat Steps 1 through 7 at least 10 times every 1-2 hours when you are awake. Take your time and take a few normal breaths between deep breaths. 9. The spirometer may include an indicator to show your best effort. Use the indicator as a goal to work toward during each repetition. 10. After each set of 10 deep breaths, practice coughing to be sure your lungs are clear. If you have an incision (the cut made at the time of surgery), support your incision when coughing by placing a pillow or rolled up towels firmly against it. Once you are able to get out of bed, walk around indoors and cough well. You may stop using the incentive spirometer when instructed by your caregiver.  RISKS AND COMPLICATIONS  Take your time so you do not get dizzy or light-headed.  If you are in pain, you may need to take or ask for pain medication before doing incentive spirometry. It is harder to take a deep breath if you are having pain. AFTER USE  Rest and breathe slowly and easily.  It can  be helpful to keep track of a log of your progress. Your caregiver can provide you with a simple table to help with this. If you are using the spirometer at home, follow these instructions: [place] IF:   You are having difficultly using the spirometer.  You have trouble using the spirometer as often as instructed.  Your pain medication is not giving enough relief while using the spirometer.  You develop fever of 100.5 F (38.1 C) or higher. SEEK IMMEDIATE MEDICAL CARE IF:   You cough up bloody sputum that had not been present before.  You develop fever of 102 F (38.9 C) or greater.  You develop worsening pain at or near the incision site. MAKE SURE YOU:   Understand these instructions.  Will watch your condition.  Will get help right away if you are not doing well or get worse. Document Released: 07/29/2006 Document Revised: 06/10/2011 Document Reviewed: 09/29/2006 ExitCare Patient Information 2014 ExitCare, [place].   ________________________________________________________________________  WHAT IS A BLOOD TRANSFUSION? Blood Transfusion Information  A transfusion is the replacement of blood or some of its parts. Blood is made up of multiple cells which provide different functions.  Red blood cells carry oxygen and are used for blood loss replacement.  White blood cells fight against infection.  Platelets control bleeding.  Plasma helps clot blood.  Other blood products are available for specialized needs, such as hemophilia or other clotting disorders. BEFORE THE TRANSFUSION  Who gives blood for transfusions?   Healthy volunteers who are fully evaluated to make sure their blood is safe. This is blood bank blood. Transfusion therapy is the safest it has ever been in the practice of medicine. Before blood is taken from a donor, a complete history is taken to make sure that person has no history of diseases nor engages in risky social behavior (examples are  intravenous drug use or sexual activity with multiple partners). The donor's travel history is screened to minimize risk of transmitting infections, such as malaria. The donated blood is tested for signs of  infectious diseases, such as HIV and hepatitis. The blood is then tested to be sure it is compatible with you in order to minimize the chance of a transfusion reaction. If you or a relative donates blood, this is often done in anticipation of surgery and is not appropriate for emergency situations. It takes many days to process the donated blood. RISKS AND COMPLICATIONS Although transfusion therapy is very safe and saves many lives, the main dangers of transfusion include:   Getting an infectious disease.  Developing a transfusion reaction. This is an allergic reaction to something in the blood you were given. Every precaution is taken to prevent this. The decision to have a blood transfusion has been considered carefully by your caregiver before blood is given. Blood is not given unless the benefits outweigh the risks. AFTER THE TRANSFUSION  Right after receiving a blood transfusion, you will usually feel much better and more energetic. This is especially true if your red blood cells have gotten low (anemic). The transfusion raises the level of the red blood cells which carry oxygen, and this usually causes an energy increase.  The nurse administering the transfusion will monitor you carefully for complications. HOME CARE INSTRUCTIONS  No special instructions are needed after a transfusion. You may find your energy is better. Speak with your caregiver about any limitations on activity for underlying diseases you may have. SEEK MEDICAL CARE IF:   Your condition is not improving after your transfusion.  You develop redness or irritation at the intravenous (IV) site. SEEK IMMEDIATE MEDICAL CARE IF:  Any of the following symptoms occur over the next 12 hours:  Shaking chills.  You have a  temperature by mouth above 102 F (38.9 C), not controlled by medicine.  Chest, back, or muscle pain.  People around you feel you are not acting correctly or are confused.  Shortness of breath or difficulty breathing.  Dizziness and fainting.  You get a rash or develop hives.  You have a decrease in urine output.  Your urine turns a dark color or changes to pink, red, or brown. Any of the following symptoms occur over the next 10 days:  You have a temperature by mouth above 102 F (38.9 C), not controlled by medicine.  Shortness of breath.  Weakness after normal activity.  The white part of the eye turns yellow (jaundice).  You have a decrease in the amount of urine or are urinating less often.  Your urine turns a dark color or changes to pink, red, or brown. Document Released: 03/15/2000 Document Revised: 06/10/2011 Document Reviewed: 11/02/2007 [facility] Patient Information 2014 [place], [place].  _______________________________________________________________________

## 2019-08-25 NOTE — Progress Notes (Addendum)
PCP - Lawerance Cruel, MD  Cardiologist - Josue Hector, MD LOV 02-10-19  Chest x-ray -  EKG - 02-10-19  Stress Test -  ECHO -  Cardiac Cath -   Sleep Study -  CPAP -   Fasting Blood Sugar -  Checks Blood Sugar _____ times a day  Blood Thinner Instructions: Aspirin Instructions: 81 mg ASA  Last Dose:  COVID vaccination x 2  Anesthesia review:   Patient denies shortness of breath, fever, cough and chest pain at PAT appointment   Patient verbalized understanding of instructions that were given to them at the PAT appointment. Patient was also instructed that they will need to review over the PAT instructions again at home before surgery.

## 2019-08-26 ENCOUNTER — Other Ambulatory Visit: Payer: Self-pay | Admitting: Physician Assistant

## 2019-09-01 ENCOUNTER — Other Ambulatory Visit: Payer: Self-pay

## 2019-09-01 ENCOUNTER — Encounter (HOSPITAL_COMMUNITY)
Admission: RE | Admit: 2019-09-01 | Discharge: 2019-09-01 | Disposition: A | Discharge status: Home / Self Care | Payer: Medicare Other | Source: Ambulatory Visit | Attending: Orthopaedic Surgery | Admitting: Orthopaedic Surgery

## 2019-09-01 ENCOUNTER — Encounter (HOSPITAL_COMMUNITY): Payer: Self-pay

## 2019-09-01 DIAGNOSIS — Z01812 Encounter for preprocedural laboratory examination: Secondary | ICD-10-CM | POA: Insufficient documentation

## 2019-09-01 LAB — SURGICAL PCR SCREEN
MRSA, PCR: NEGATIVE
Staphylococcus aureus: NEGATIVE

## 2019-09-01 LAB — CBC
HCT: 42.5 % (ref 36.0–46.0)
Hemoglobin: 13.9 g/dL (ref 12.0–15.0)
MCH: 31.7 pg (ref 26.0–34.0)
MCHC: 32.7 g/dL (ref 30.0–36.0)
MCV: 96.8 fL (ref 80.0–100.0)
Platelets: 253 10*3/uL (ref 150–400)
RBC: 4.39 MIL/uL (ref 3.87–5.11)
RDW: 13 % (ref 11.5–15.5)
WBC: 8.3 10*3/uL (ref 4.0–10.5)
nRBC: 0 % (ref 0.0–0.2)

## 2019-09-01 LAB — TYPE AND SCREEN
ABO/RH(D): O NEG
Antibody Screen: NEGATIVE

## 2019-09-02 LAB — ABO/RH: ABO/RH(D): O NEG

## 2019-09-03 ENCOUNTER — Telehealth: Payer: Self-pay

## 2019-09-03 NOTE — Telephone Encounter (Signed)
   [facility] Pre-operative Risk Assessment    HEARTCARE STAFF: - Please ensure there is not already an duplicate clearance open for this procedure. - Under Visit Info/Reason for Call, type in Other and utilize the format Clearance MM/DD/YY or Clearance TBD. Do not use dashes or single digits. - If request is for dental extraction, please clarify the # of teeth to be extracted.  Request for surgical clearance:  1. What type of surgery is being performed? RIGHT TOTAL HIP ARTHOPLASTY   2. When is this surgery scheduled? 09/10/19   3. What type of clearance is required (medical clearance vs. Pharmacy clearance to hold med vs. Both)? MEDICAL  4. Are there any medications that need to be held prior to surgery and how long? ASA PLEASE ADVISE   5. Practice name and name of physician performing surgery? [facility]; DR. Harrell Gave [place]   6. What is the office phone number? [phone]   7.   What is the office fax number? [place].   Anesthesia type (None, local, MAC, general) ? GENERAL VS. SPINAL   Jacinta Shoe 09/03/2019, 5:29 PM  _________________________________________________________________   (provider comments below)

## 2019-09-06 NOTE — Telephone Encounter (Signed)
   Primary Cardiologist:Peter Johnsie Cancel, MD  Chart reviewed as part of pre-operative protocol coverage.Last office visit was in 01/2019. Because of Temekia Caskey Bubb's past medical history and time since last visit, they will require a follow-up visit in order to better assess preoperative cardiovascular risk. Will hopefully be able to arrange this prior to surgery on 09/10/2019 but if not surgery may need to be delayed.   Pre-op covering staff: - Please schedule appointment and call patient to inform them. If patient already had an upcoming appointment within acceptable timeframe, please add "pre-op clearance" to the appointment notes so provider is aware. - Please contact requesting surgeon's office via preferred method (i.e, phone, fax) to inform them of need for appointment prior to surgery.  If applicable, this message will also be routed to pharmacy pool and/or primary cardiologist for input on holding anticoagulant/antiplatelet agent as requested below so that this information is available to the clearing provider at time of patient's appointment.   Darreld Mclean, PA-C  09/06/2019, 9:56 AM

## 2019-09-06 NOTE — Telephone Encounter (Signed)
Spoke with patient who is agreeable to see Robbie Lis, PA-C on 09/09/19 at 8:45 am. Pt thanked me for the call. I will fax over recommendations to requesting surgeon's office.

## 2019-09-07 ENCOUNTER — Other Ambulatory Visit (HOSPITAL_COMMUNITY)
Admission: RE | Admit: 2019-09-07 | Discharge: 2019-09-07 | Disposition: A | Discharge status: Home / Self Care | Payer: Medicare Other | Source: Ambulatory Visit | Attending: Orthopaedic Surgery | Admitting: Orthopaedic Surgery

## 2019-09-07 DIAGNOSIS — Z20822 Contact with and (suspected) exposure to covid-19: Secondary | ICD-10-CM | POA: Diagnosis not present

## 2019-09-07 DIAGNOSIS — Z01812 Encounter for preprocedural laboratory examination: Secondary | ICD-10-CM | POA: Diagnosis not present

## 2019-09-07 LAB — SARS CORONAVIRUS 2 (TAT 6-24 HRS): SARS Coronavirus 2: NEGATIVE

## 2019-09-08 NOTE — Progress Notes (Addendum)
Anesthesia Chart Review   Case: [id number] Date/Time: 09/10/19 1045   Procedure: RIGHT TOTAL HIP ARTHROPLASTY ANTERIOR APPROACH (Right Hip)   Anesthesia type: Choice   Pre-op diagnosis: right hip osteoarthritis   Location: [facility] ROOM 10 / [facility]   Surgeons: Mcarthur Rossetti, MD      DISCUSSION:74 y.o. never smoker with h/o right hip OA scheduled for above procedure 09/10/2019 with Dr. Jean Rosenthal.   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  Per last OV note with cardiologist SBE prophylaxis life long.    Cardiac clearance requested.  Cardiology requires pt to have an office visit to better assess cardiovascular risk.  Appointment scheduled 09/09/2019.   Addendum 09/09/2019:  Pt seen by cardiologist 09/09/2019.  Per OV note, "Preoperative cardiovascular exam: Patient has history of mitral valve regurgitation and mitral valve prolapse.  She is currently feeling well.  She is able to do 4 METS of activity despite her hip issues.  She is planned for hip arthroplasty.  At this point, she would be at intermediate risk for an intermediate risk procedure.  No further cardiac work-up is necessary at this time." VS: BP (!) 161/77   Pulse 68   Resp 16   Ht 5\' 1"  (1.549 m)   Wt 54.6 kg   SpO2 99%   BMI 22.74 kg/m   PROVIDERS: Lawerance Cruel, MD is PCP   [name], MD is Cardiologist  LABS: Labs reviewed: Acceptable for surgery. (all labs ordered are listed, but only abnormal results are displayed)  Labs Reviewed  ABO/RH     IMAGES:   EKG: 02/10/2019 Rate 73 bpm  Normal sinus rhythm with sinus arrhythmia  CV: Echo 12/17/17 Study Conclusions   - Left ventricle: The cavity size was normal. Wall thickness was  increased in a pattern of mild LVH. Systolic function was  vigorous. The estimated ejection fraction was in the range of 65%  to 70%. Wall motion was normal; there were no regional wall  motion abnormalities.  Doppler parameters are consistent with  abnormal left ventricular relaxation (grade 1 diastolic  dysfunction). The E/e&' ratio is between 8-15, suggesting  indeterminate LV filling pressure.  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 There was mild regurgitation.  - Left atrium: The atrium was normal in size.  - Tricuspid valve: There was trivial regurgitation.  - Pulmonary arteries: PA peak pressure: 22 mm Hg (S).  - Inferior vena cava: The vessel was normal in size. The  respirophasic diameter changes were in the normal range (>= 50%),  consistent with normal central venous pressure.   Impressions:   - Compared to a prior study in 12/2016, LV filling pressures are  lower and there is mild MR with enlogation and myoxmatous changes  of the anterior leaflet with mild late systolic prolapse. Past Medical History:  Diagnosis Date  . Anemia    normocytic  . Complication of anesthesia    HAD RAPID HEART BEAT AFTER ANESTHIA  . Family history of anesthesia complication    Father had difficulty waking up  . H/O seasonal allergies   . Heart murmur     Past Surgical History:  Procedure Laterality Date  . ABDOMINAL HYSTERECTOMY     1997  . LUMBAR DISC SURGERY     herniated disk  . SPINE SURGERY    . TEE WITHOUT CARDIOVERSION N/A 12/07/2012   Procedure: TRANSESOPHAGEAL ECHOCARDIOGRAM (TEE);  Surgeon: Dani Gobble  Croitoru, MD;  Location: [facility];  Service: Cardiovascular;  Laterality: N/A;    MEDICATIONS: . aspirin 81 MG tablet  . Biotin 10000 MCG TABS  . calcium gluconate 500 MG tablet  . Cholecalciferol (VITAMIN D) 2000 UNITS CAPS  . doxepin (SINEQUAN) 25 MG capsule  . fluticasone (FLONASE) 50 MCG/ACT nasal spray  . metroNIDAZOLE (METROGEL) 1 % gel  . Multiple Vitamins-Minerals (MULTIVITAMIN PO)  . Naphazoline-Pheniramine (OPCON-A) 0.027-0.315 % SOLN  . Omega-3 Fatty Acids (FISH OIL PO)  . pravastatin  (PRAVACHOL) 20 MG tablet  . verapamil (CALAN-SR) 240 MG CR tablet   No current facility-administered medications for this encounter.     Maia Plan [facility] Pre-Surgical Testing [phone] 09/08/19  9:49 AM

## 2019-09-09 ENCOUNTER — Encounter: Payer: Self-pay | Admitting: Cardiology

## 2019-09-09 ENCOUNTER — Ambulatory Visit (INDEPENDENT_AMBULATORY_CARE_PROVIDER_SITE_OTHER): Payer: Medicare Other | Admitting: Cardiology

## 2019-09-09 ENCOUNTER — Telehealth: Payer: Self-pay | Admitting: *Deleted

## 2019-09-09 ENCOUNTER — Ambulatory Visit: Payer: Medicare Other | Admitting: Physician Assistant

## 2019-09-09 ENCOUNTER — Other Ambulatory Visit: Payer: Self-pay

## 2019-09-09 VITALS — BP 160/82 | HR 84 | Ht 61.0 in | Wt 121.0 lb

## 2019-09-09 DIAGNOSIS — Z01810 Encounter for preprocedural cardiovascular examination: Secondary | ICD-10-CM | POA: Diagnosis not present

## 2019-09-09 NOTE — Care Plan (Signed)
Spoke with patient prior to surgery for a R-THA with Dr. Ninfa Linden on 09/10/19. Patient is an ortho bundle patient through [facility].  She has a spouse that will be able to assist her at home after discharge. She has no DME and will need a FWW and 3in1. Referral made to DME agency [facility]). Anticipate HHPT will be needed after short hospital stay. Choice provided and referral to [facility]. Reviewed all post-op care instructions and provided opportunity for patient to ask questions. Will continue to follow for all needs.

## 2019-09-09 NOTE — Progress Notes (Signed)
Electrophysiology Office Note   Date:  09/09/2019   ID:  DANNAH RYLES, DOB Jun 29, 1945, MRN [id number]  PCP:  Lawerance Cruel, MD  Cardiologist:  Johnsie Cancel Primary Electrophysiologist:     Chief Complaint: Preoperative evaluation   History of Present Illness: KRISTALYNN CODDINGTON is a 74 y.o. female who is being seen today for the evaluation of preoperative evaluation at the request of Lawerance Cruel, MD. Presenting today for electrophysiology evaluation.  She has a history of mitral regurgitation with mitral valve prolapse.  She has a planned right hip total arthroplasty.  Is scheduled 09/10/2019.  Today, she denies symptoms of palpitations, chest pain, shortness of breath, orthopnea, PND, lower extremity edema, claudication, dizziness, presyncope, syncope, bleeding, or neurologic sequela. The patient is tolerating medications without difficulties.  She can do all of her daily activities without restriction.  She can climb 2 flights of stairs, only limited by pain in her hip.  She can walk without issue as well.  She does not get chest pain or shortness of breath.   Past Medical History:  Diagnosis Date  . Anemia    normocytic  . Complication of anesthesia    HAD RAPID HEART BEAT AFTER ANESTHIA  . Family history of anesthesia complication    Father had difficulty waking up  . H/O seasonal allergies   . Heart murmur    Past Surgical History:  Procedure Laterality Date  . ABDOMINAL HYSTERECTOMY     1997  . LUMBAR DISC SURGERY     herniated disk  . SPINE SURGERY    . TEE WITHOUT CARDIOVERSION N/A 12/07/2012   Procedure: TRANSESOPHAGEAL ECHOCARDIOGRAM (TEE);  Surgeon: Sanda Klein, MD;  Location: [facility] ENDOSCOPY;  Service: Cardiovascular;  Laterality: N/A;     Current Outpatient Medications  Medication Sig Dispense Refill  . aspirin 81 MG tablet Take 81 mg by mouth daily.    . Biotin 10000 MCG TABS Take 10,000 mcg by mouth daily.    . calcium gluconate 500 MG tablet  Take 2 tablets by mouth daily.     . Cholecalciferol (VITAMIN D) 2000 UNITS CAPS Take 2,000 Units by mouth daily.     Marland Kitchen doxepin (SINEQUAN) 25 MG capsule Take 25 mg by mouth daily.  4  . fluticasone (FLONASE) 50 MCG/ACT nasal spray Place 1 spray into both nostrils at bedtime.    . metroNIDAZOLE (METROGEL) 1 % gel Apply 1 application topically daily. ROSACEA    . Multiple Vitamins-Minerals (MULTIVITAMIN PO) Take 1 tablet by mouth daily.     . Naphazoline-Pheniramine (OPCON-A) 0.027-0.315 % SOLN Place 1 drop into both eyes in the morning and at bedtime.    . Omega-3 Fatty Acids (FISH OIL PO) Take 1 capsule by mouth daily.     . pravastatin (PRAVACHOL) 20 MG tablet Take 20 mg by mouth daily.    . verapamil (CALAN-SR) 240 MG CR tablet Take 240 mg by mouth daily.      No current facility-administered medications for this visit.    Allergies:   Patient has no known allergies.   Social History:  The patient  reports that she has never smoked. She has never used smokeless tobacco. She reports current alcohol use. She reports that she does not use drugs.   Family History:  The patient's family history includes Cancer in her sister; Dementia in her mother; Heart attack in her mother; Kidney disease in her father; Stroke in her father.    ROS:  Please see the history  of present illness.   Otherwise, review of systems is positive for none.   All other systems are reviewed and negative.    PHYSICAL EXAM: VS:  There were no vitals taken for this visit. , BMI There is no height or weight on file to calculate BMI. GEN: Well nourished, well developed, in no acute distress  HEENT: normal  Neck: no JVD, carotid bruits, or masses Cardiac: RRR; no murmurs, rubs, or gallops,no edema  Respiratory:  clear to auscultation bilaterally, normal work of breathing GI: soft, nontender, nondistended, + BS MS: no deformity or atrophy  Skin: warm and dry Neuro:  Strength and sensation are intact Psych: euthymic  mood, full affect  EKG:  EKG is ordered today. Personal review of the ekg ordered shows sinus rhythm, rate 84  Recent Labs: 09/01/2019: Hemoglobin 13.9; Platelets 253    Lipid Panel  No results found for: CHOL, TRIG, HDL, CHOLHDL, VLDL, LDLCALC, LDLDIRECT   Wt Readings from Last 3 Encounters:  09/01/19 120 lb 6 oz (54.6 kg)  09/01/19 119 lb (54 kg)  08/03/19 119 lb (54 kg)      Other studies Reviewed: Additional studies/ records that were reviewed today include: TTE 12/17/17  Review of the above records today demonstrates:  - Left ventricle: The cavity size was normal. Wall thickness was  increased in a pattern of mild LVH. Systolic function was  vigorous. The estimated ejection fraction was in the range of 65%  to 70%. Wall motion was normal; there were no regional wall  motion abnormalities. Doppler parameters are consistent with  abnormal left ventricular relaxation (grade 1 diastolic  dysfunction). The E/e&' ratio is between 8-15, suggesting  indeterminate LV filling pressure.  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 There was mild regurgitation.  - Left atrium: The atrium was normal in size.  - Tricuspid valve: There was trivial regurgitation.  - Pulmonary arteries: PA peak pressure: 22 mm Hg (S).  - Inferior vena cava: The vessel was normal in size. The  respirophasic diameter changes were in the normal range (>= 50%),  consistent with normal central venous pressure.    ASSESSMENT AND PLAN:  1.  Preoperative cardiovascular exam: Patient has history of mitral valve regurgitation and mitral valve prolapse.  She is currently feeling well.  She is able to do 4 METS of activity despite her hip issues.  She is planned for hip arthroplasty.  At this point, she would be at intermediate risk for an intermediate risk procedure.  No further cardiac work-up is necessary at this time.    Current medicines  are reviewed at length with the patient today.   The patient does not have concerns regarding her medicines.  The following changes were made today:  none  Labs/ tests ordered today include:  No orders of the defined types were placed in this encounter.   Signed, Hisayo Delossantos Meredith Leeds, MD  09/09/2019 1:55 PM     [address] [place] [address] [phone] (office) [phone] (fax)'

## 2019-09-09 NOTE — Telephone Encounter (Signed)
Attempted Ortho bundle pre-op call to patient.  

## 2019-09-09 NOTE — Anesthesia Preprocedure Evaluation (Addendum)
Anesthesia Evaluation  Patient identified by MRN, date of birth, ID band Patient awake    Reviewed: Allergy & Precautions, NPO status , Patient's Chart, lab work & pertinent test results  History of Anesthesia Complications (+) Family history of anesthesia reaction and history of anesthetic complications (Fast heart beat after surgery)  Airway Mallampati: II  TM Distance: >3 FB Neck ROM: Full    Dental  (+) Teeth Intact, Dental Advisory Given   Pulmonary neg pulmonary ROS,    Pulmonary exam normal breath sounds clear to auscultation       Cardiovascular (-) hypertension(-) angina(-) CAD and (-) Past MI negative cardio ROS Normal cardiovascular exam Rhythm:Regular Rate:Normal     Neuro/Psych negative neurological ROS  negative psych ROS   GI/Hepatic negative GI ROS, Neg liver ROS,   Endo/Other  negative endocrine ROS  Renal/GU negative Renal ROS     Musculoskeletal  (+) Arthritis ,   Abdominal   Peds  Hematology negative hematology ROS (+) Plt 253k   Anesthesia Other Findings Day of surgery medications reviewed with the patient.  Reproductive/Obstetrics                             Anesthesia Physical Anesthesia Plan  ASA: II  Anesthesia Plan: General   Post-op Pain Management:    Induction: Intravenous  PONV Risk Score and Plan: 3 and Dexamethasone, Ondansetron and Treatment may vary due to age or medical condition  Airway Management Planned: Oral ETT  Additional Equipment:   Intra-op Plan:   Post-operative Plan: Extubation in OR  Informed Consent: I have reviewed the patients History and Physical, chart, labs and discussed the procedure including the risks, benefits and alternatives for the proposed anesthesia with the patient or authorized representative who has indicated his/her understanding and acceptance.     Dental advisory given  Plan Discussed with:  CRNA  Anesthesia Plan Comments: (See PAT note 09/01/2019, Konrad Felix, PA-C)       Anesthesia Quick Evaluation

## 2019-09-09 NOTE — H&P (Signed)
TOTAL HIP ADMISSION H&P  Patient is admitted for right total hip arthroplasty.  Subjective:  Chief Complaint: right hip pain  HPI: Lisa Cabrera, 74 y.o. female, has a history of pain and functional disability in the right hip(s) due to arthritis and patient has failed non-surgical conservative treatments for greater than 12 weeks to include NSAID's and/or analgesics, flexibility and strengthening excercises, use of assistive devices and activity modification.  Onset of symptoms was gradual starting 1 years ago with gradually worsening course since that time.The patient noted no past surgery on the right hip(s).  Patient currently rates pain in the right hip at 10 out of 10 with activity. Patient has night pain, worsening of pain with activity and weight bearing, pain that interfers with activities of daily living and pain with passive range of motion. Patient has evidence of subchondral sclerosis, periarticular osteophytes and joint space narrowing by imaging studies. This condition presents safety issues increasing the risk of falls.  There is no current active infection.  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   Past Medical History:  Diagnosis Date   Anemia    normocytic   Complication of anesthesia    HAD RAPID HEART BEAT AFTER ANESTHIA   Family history of anesthesia complication    Father had difficulty waking up   H/O seasonal allergies    Heart murmur     Past Surgical History:  Procedure Laterality Date   ABDOMINAL HYSTERECTOMY     1997   LUMBAR DISC SURGERY     herniated disk   SPINE SURGERY     TEE WITHOUT CARDIOVERSION N/A 12/07/2012   Procedure: TRANSESOPHAGEAL ECHOCARDIOGRAM (TEE);  Surgeon: Sanda Klein, MD;  Location: [facility] ENDOSCOPY;  Service:  Cardiovascular;  Laterality: N/A;    No current facility-administered medications for this encounter.   Current Outpatient Medications  Medication Sig Dispense Refill Last Dose   aspirin 81 MG tablet Take 81 mg by mouth daily.      Biotin 10000 MCG TABS Take 10,000 mcg by mouth daily.      calcium gluconate 500 MG tablet Take 2 tablets by mouth daily.       Cholecalciferol (VITAMIN D) 2000 UNITS CAPS Take 2,000 Units by mouth daily.       doxepin (SINEQUAN) 25 MG capsule Take 25 mg by mouth daily.  4    fluticasone (FLONASE) 50 MCG/ACT nasal spray Place 1 spray into both nostrils at bedtime.      Multiple Vitamins-Minerals (MULTIVITAMIN PO) Take 1 tablet by mouth daily.       Naphazoline-Pheniramine (OPCON-A) 0.027-0.315 % SOLN Place 1 drop into both eyes in the morning and at bedtime.      Omega-3 Fatty Acids (FISH OIL PO) Take 1 capsule by mouth daily.       pravastatin (PRAVACHOL) 20 MG tablet Take 20 mg by mouth daily.      verapamil (CALAN-SR) 240 MG CR tablet Take 240 mg by mouth daily.       metroNIDAZOLE (METROGEL) 1 % gel Apply 1 application topically daily. ROSACEA      No Known Allergies  Social History   Tobacco Use   Smoking status: Never Smoker   Smokeless tobacco: Never Used  Substance Use Topics   Alcohol use: Yes    Comment: occasionally  Family History  Problem Relation Age of Onset   Dementia Mother    Heart attack Mother    Stroke Father    Kidney disease Father    Cancer Sister        ovarian     Review of Systems  All other systems reviewed and are negative.   Objective:  Physical Exam  Constitutional: She is oriented to person, place, and time.  HENT:  Head: Normocephalic and atraumatic.  Eyes: Pupils are equal, round, and reactive to light.  Cardiovascular: Normal rate and normal pulses.  Respiratory: Effort normal.  GI: Soft. Normal appearance.  Musculoskeletal:     Cervical back: Normal range of motion.     Right  hip: Tenderness and bony tenderness present. Decreased range of motion. Decreased strength.  Neurological: She is alert and oriented to person, place, and time.  Skin: Skin is warm and dry.  Psychiatric: Her behavior is normal. Mood normal.    Vital signs in last 24 hours: Pulse Rate:  [84] 84 (06/10 1556) BP: (160)/(82) 160/82 (06/10 1556) SpO2:  [96 %] 96 % (06/10 1556) Weight:  [54.9 kg] 54.9 kg (06/10 1556)  Labs:   Estimated body mass index is 22.86 kg/m as calculated from the following:   Height as of 09/09/19: 5\' 1"  (1.549 m).   Weight as of 09/09/19: 54.9 kg.   Imaging Review Plain radiographs demonstrate severe degenerative joint disease of the right hip(s). The bone quality appears to be good for age and reported activity level.      Assessment/Plan:  End stage arthritis, right hip(s)  The patient history, physical examination, clinical judgement of the provider and imaging studies are consistent with end stage degenerative joint disease of the right hip(s) and total hip arthroplasty is deemed medically necessary. The treatment options including medical management, injection therapy, arthroscopy and arthroplasty were discussed at length. The risks and benefits of total hip arthroplasty were presented and reviewed. The risks due to aseptic loosening, infection, stiffness, dislocation/subluxation,  thromboembolic complications and other imponderables were discussed.  The patient acknowledged the explanation, agreed to proceed with the plan and consent was signed. Patient is being admitted for inpatient treatment for surgery, pain control, PT, OT, prophylactic antibiotics, VTE prophylaxis, progressive ambulation and ADL's and discharge planning.The patient is planning to be discharged home with home health services

## 2019-09-09 NOTE — Telephone Encounter (Signed)
Ortho bundle pre-op call completed. 

## 2019-09-10 ENCOUNTER — Ambulatory Visit (HOSPITAL_COMMUNITY): Payer: Medicare Other

## 2019-09-10 ENCOUNTER — Observation Stay (HOSPITAL_COMMUNITY)
Admission: RE | Admit: 2019-09-10 | Discharge: 2019-09-11 | Disposition: A | Discharge status: Home / Self Care | Payer: Medicare Other | Attending: Orthopaedic Surgery | Admitting: Orthopaedic Surgery

## 2019-09-10 ENCOUNTER — Other Ambulatory Visit: Payer: Self-pay

## 2019-09-10 ENCOUNTER — Telehealth (HOSPITAL_COMMUNITY): Payer: Self-pay | Admitting: *Deleted

## 2019-09-10 ENCOUNTER — Encounter (HOSPITAL_COMMUNITY)
Admission: RE | Disposition: A | Discharge status: Home / Self Care | Payer: Self-pay | Source: Home / Self Care | Attending: Orthopaedic Surgery

## 2019-09-10 ENCOUNTER — Encounter (HOSPITAL_COMMUNITY): Payer: Self-pay | Admitting: Orthopaedic Surgery

## 2019-09-10 ENCOUNTER — Observation Stay (HOSPITAL_COMMUNITY): Payer: Medicare Other

## 2019-09-10 ENCOUNTER — Ambulatory Visit (HOSPITAL_COMMUNITY): Payer: Medicare Other | Admitting: Anesthesiology

## 2019-09-10 ENCOUNTER — Ambulatory Visit (HOSPITAL_COMMUNITY): Payer: Medicare Other | Admitting: Physician Assistant

## 2019-09-10 DIAGNOSIS — M1611 Unilateral primary osteoarthritis, right hip: Secondary | ICD-10-CM | POA: Diagnosis not present

## 2019-09-10 DIAGNOSIS — Z79899 Other long term (current) drug therapy: Secondary | ICD-10-CM | POA: Insufficient documentation

## 2019-09-10 DIAGNOSIS — Z419 Encounter for procedure for purposes other than remedying health state, unspecified: Secondary | ICD-10-CM

## 2019-09-10 DIAGNOSIS — D509 Iron deficiency anemia, unspecified: Secondary | ICD-10-CM | POA: Diagnosis not present

## 2019-09-10 DIAGNOSIS — Z96641 Presence of right artificial hip joint: Secondary | ICD-10-CM

## 2019-09-10 DIAGNOSIS — Z471 Aftercare following joint replacement surgery: Secondary | ICD-10-CM | POA: Diagnosis not present

## 2019-09-10 DIAGNOSIS — Z7982 Long term (current) use of aspirin: Secondary | ICD-10-CM | POA: Insufficient documentation

## 2019-09-10 HISTORY — PX: TOTAL HIP ARTHROPLASTY: SHX124

## 2019-09-10 LAB — TYPE AND SCREEN
ABO/RH(D): O NEG
Antibody Screen: NEGATIVE

## 2019-09-10 SURGERY — ARTHROPLASTY, HIP, TOTAL, ANTERIOR APPROACH
Anesthesia: General | Site: Hip | Laterality: Right

## 2019-09-10 MED ORDER — SUGAMMADEX SODIUM 200 MG/2ML IV SOLN
INTRAVENOUS | Status: DC | PRN
  Administered 2019-09-10: 100 mg via INTRAVENOUS

## 2019-09-10 MED ORDER — METHOCARBAMOL 500 MG IVPB - SIMPLE MED
500.0000 mg | Freq: Four times a day (QID) | INTRAVENOUS | Status: DC | PRN
  Administered 2019-09-10: 500 mg via INTRAVENOUS
  Filled 2019-09-10: qty 50

## 2019-09-10 MED ORDER — PRAVASTATIN SODIUM 20 MG PO TABS
20.0000 mg | ORAL_TABLET | Freq: Every day | ORAL | Status: DC
  Administered 2019-09-10: 20 mg via ORAL
  Filled 2019-09-10: qty 1

## 2019-09-10 MED ORDER — ORAL CARE MOUTH RINSE: 15.0000 mL | Freq: Once | OROMUCOSAL | Status: AC

## 2019-09-10 MED ORDER — SODIUM CHLORIDE 0.9 % IV SOLN
INTRAVENOUS | Status: DC
  Administered 2019-09-10: 75 mL/h via INTRAVENOUS

## 2019-09-10 MED ORDER — BIOTIN 10000 MCG PO TABS: 10000.0000 ug | ORAL_TABLET | Freq: Every day | ORAL | Status: DC

## 2019-09-10 MED ORDER — DEXAMETHASONE SODIUM PHOSPHATE 10 MG/ML IJ SOLN
INTRAMUSCULAR | Status: DC | PRN
  Administered 2019-09-10: 8 mg via INTRAVENOUS

## 2019-09-10 MED ORDER — CEFAZOLIN SODIUM-DEXTROSE 2-4 GM/100ML-% IV SOLN
2.0000 g | INTRAVENOUS | Status: AC
  Administered 2019-09-10: 2 g via INTRAVENOUS
  Filled 2019-09-10: qty 100

## 2019-09-10 MED ORDER — ONDANSETRON HCL 4 MG/2ML IJ SOLN: 4.0000 mg | Freq: Once | INTRAMUSCULAR | Status: DC | PRN

## 2019-09-10 MED ORDER — ONDANSETRON HCL 4 MG PO TABS: 4.0000 mg | ORAL_TABLET | Freq: Four times a day (QID) | ORAL | Status: DC | PRN

## 2019-09-10 MED ORDER — LIDOCAINE 2% (20 MG/ML) 5 ML SYRINGE
INTRAMUSCULAR | Status: AC
  Filled 2019-09-10: qty 5

## 2019-09-10 MED ORDER — FENTANYL CITRATE (PF) 100 MCG/2ML IJ SOLN
INTRAMUSCULAR | Status: AC
  Filled 2019-09-10: qty 2

## 2019-09-10 MED ORDER — MIDAZOLAM HCL 2 MG/2ML IJ SOLN
INTRAMUSCULAR | Status: DC | PRN
  Administered 2019-09-10: 1 mg via INTRAVENOUS

## 2019-09-10 MED ORDER — TRANEXAMIC ACID-NACL 1000-0.7 MG/100ML-% IV SOLN
1000.0000 mg | INTRAVENOUS | Status: AC
  Administered 2019-09-10: 1000 mg via INTRAVENOUS
  Filled 2019-09-10: qty 100

## 2019-09-10 MED ORDER — 0.9 % SODIUM CHLORIDE (POUR BTL) OPTIME
TOPICAL | Status: DC | PRN
  Administered 2019-09-10: 1000 mL

## 2019-09-10 MED ORDER — ACETAMINOPHEN 500 MG PO TABS
1000.0000 mg | ORAL_TABLET | Freq: Once | ORAL | Status: AC
  Administered 2019-09-10: 1000 mg via ORAL
  Filled 2019-09-10: qty 2

## 2019-09-10 MED ORDER — MIDAZOLAM HCL 2 MG/2ML IJ SOLN
INTRAMUSCULAR | Status: AC
  Filled 2019-09-10: qty 2

## 2019-09-10 MED ORDER — PROPOFOL 10 MG/ML IV BOLUS
INTRAVENOUS | Status: AC
  Filled 2019-09-10: qty 20

## 2019-09-10 MED ORDER — SODIUM CHLORIDE 0.9 % IR SOLN
Status: DC | PRN
  Administered 2019-09-10: 1000 mL

## 2019-09-10 MED ORDER — ALUM & MAG HYDROXIDE-SIMETH 200-200-20 MG/5ML PO SUSP: 30.0000 mL | ORAL | Status: DC | PRN

## 2019-09-10 MED ORDER — OXYCODONE HCL 5 MG PO TABS: 10.0000 mg | ORAL_TABLET | ORAL | Status: DC | PRN

## 2019-09-10 MED ORDER — DEXAMETHASONE SODIUM PHOSPHATE 10 MG/ML IJ SOLN
INTRAMUSCULAR | Status: AC
  Filled 2019-09-10: qty 1

## 2019-09-10 MED ORDER — OXYCODONE HCL 5 MG PO TABS
5.0000 mg | ORAL_TABLET | ORAL | Status: DC | PRN
  Administered 2019-09-10: 5 mg via ORAL
  Filled 2019-09-10: qty 1
  Filled 2019-09-10: qty 2

## 2019-09-10 MED ORDER — PANTOPRAZOLE SODIUM 40 MG PO TBEC
40.0000 mg | DELAYED_RELEASE_TABLET | Freq: Every day | ORAL | Status: DC
  Administered 2019-09-11: 40 mg via ORAL
  Filled 2019-09-10 (×2): qty 1

## 2019-09-10 MED ORDER — PROPOFOL 10 MG/ML IV BOLUS
INTRAVENOUS | Status: DC | PRN
  Administered 2019-09-10: 120 mg via INTRAVENOUS
  Administered 2019-09-10: 20 mg via INTRAVENOUS

## 2019-09-10 MED ORDER — NAPHAZOLINE-PHENIRAMINE 0.025-0.3 % OP SOLN
1.0000 [drp] | Freq: Two times a day (BID) | OPHTHALMIC | Status: DC
  Administered 2019-09-10 – 2019-09-11 (×2): 1 [drp] via OPHTHALMIC
  Filled 2019-09-10: qty 15

## 2019-09-10 MED ORDER — ASPIRIN 81 MG PO CHEW
81.0000 mg | CHEWABLE_TABLET | Freq: Two times a day (BID) | ORAL | Status: DC
  Administered 2019-09-10 – 2019-09-11 (×2): 81 mg via ORAL
  Filled 2019-09-10 (×2): qty 1

## 2019-09-10 MED ORDER — VITAMIN D 25 MCG (1000 UNIT) PO TABS
2000.0000 [IU] | ORAL_TABLET | Freq: Every day | ORAL | Status: DC
  Administered 2019-09-10 – 2019-09-11 (×2): 2000 [IU] via ORAL
  Filled 2019-09-10 (×2): qty 2

## 2019-09-10 MED ORDER — FENTANYL CITRATE (PF) 100 MCG/2ML IJ SOLN
INTRAMUSCULAR | Status: DC | PRN
  Administered 2019-09-10 (×3): 50 ug via INTRAVENOUS

## 2019-09-10 MED ORDER — METOCLOPRAMIDE HCL 5 MG/ML IJ SOLN: 5.0000 mg | Freq: Three times a day (TID) | INTRAMUSCULAR | Status: DC | PRN

## 2019-09-10 MED ORDER — ONDANSETRON HCL 4 MG/2ML IJ SOLN: 4.0000 mg | Freq: Four times a day (QID) | INTRAMUSCULAR | Status: DC | PRN

## 2019-09-10 MED ORDER — CALCIUM CARBONATE ANTACID 500 MG PO CHEW
2.0000 | CHEWABLE_TABLET | Freq: Every day | ORAL | Status: DC
  Administered 2019-09-11: 400 mg via ORAL
  Filled 2019-09-10: qty 2

## 2019-09-10 MED ORDER — METOCLOPRAMIDE HCL 5 MG PO TABS: 5.0000 mg | ORAL_TABLET | Freq: Three times a day (TID) | ORAL | Status: DC | PRN

## 2019-09-10 MED ORDER — ONDANSETRON HCL 4 MG/2ML IJ SOLN
INTRAMUSCULAR | Status: AC
  Filled 2019-09-10: qty 2

## 2019-09-10 MED ORDER — METHOCARBAMOL 500 MG IVPB - SIMPLE MED
INTRAVENOUS | Status: AC
  Filled 2019-09-10: qty 50

## 2019-09-10 MED ORDER — FLUTICASONE PROPIONATE 50 MCG/ACT NA SUSP
1.0000 | Freq: Every day | NASAL | Status: DC
  Administered 2019-09-10: 1 via NASAL
  Filled 2019-09-10: qty 16

## 2019-09-10 MED ORDER — DIPHENHYDRAMINE HCL 12.5 MG/5ML PO ELIX: 12.5000 mg | ORAL_SOLUTION | ORAL | Status: DC | PRN

## 2019-09-10 MED ORDER — FENTANYL CITRATE (PF) 100 MCG/2ML IJ SOLN
25.0000 ug | INTRAMUSCULAR | Status: DC | PRN
  Administered 2019-09-10: 50 ug via INTRAVENOUS

## 2019-09-10 MED ORDER — ROCURONIUM BROMIDE 10 MG/ML (PF) SYRINGE
PREFILLED_SYRINGE | INTRAVENOUS | Status: DC | PRN
  Administered 2019-09-10: 40 mg via INTRAVENOUS

## 2019-09-10 MED ORDER — PROPOFOL 1000 MG/100ML IV EMUL
INTRAVENOUS | Status: AC
  Filled 2019-09-10: qty 100

## 2019-09-10 MED ORDER — ROCURONIUM BROMIDE 10 MG/ML (PF) SYRINGE
PREFILLED_SYRINGE | INTRAVENOUS | Status: AC
  Filled 2019-09-10: qty 10

## 2019-09-10 MED ORDER — DOCUSATE SODIUM 100 MG PO CAPS
100.0000 mg | ORAL_CAPSULE | Freq: Two times a day (BID) | ORAL | Status: DC
  Administered 2019-09-10 – 2019-09-11 (×2): 100 mg via ORAL
  Filled 2019-09-10 (×2): qty 1

## 2019-09-10 MED ORDER — STERILE WATER FOR IRRIGATION IR SOLN
Status: DC | PRN
  Administered 2019-09-10: 2000 mL

## 2019-09-10 MED ORDER — VERAPAMIL HCL ER 240 MG PO TBCR
240.0000 mg | EXTENDED_RELEASE_TABLET | Freq: Every day | ORAL | Status: DC
  Administered 2019-09-10 – 2019-09-11 (×2): 240 mg via ORAL
  Filled 2019-09-10 (×2): qty 1

## 2019-09-10 MED ORDER — SUGAMMADEX SODIUM 500 MG/5ML IV SOLN
INTRAVENOUS | Status: AC
  Filled 2019-09-10: qty 5

## 2019-09-10 MED ORDER — ONDANSETRON HCL 4 MG/2ML IJ SOLN
INTRAMUSCULAR | Status: DC | PRN
  Administered 2019-09-10: 4 mg via INTRAVENOUS

## 2019-09-10 MED ORDER — POVIDONE-IODINE 10 % EX SWAB: 2.0000 "application " | Freq: Once | CUTANEOUS | Status: DC

## 2019-09-10 MED ORDER — PHENOL 1.4 % MT LIQD: 1.0000 | OROMUCOSAL | Status: DC | PRN

## 2019-09-10 MED ORDER — ACETAMINOPHEN 325 MG PO TABS
325.0000 mg | ORAL_TABLET | Freq: Four times a day (QID) | ORAL | Status: DC | PRN
  Administered 2019-09-10 – 2019-09-11 (×2): 650 mg via ORAL
  Filled 2019-09-10 (×2): qty 2

## 2019-09-10 MED ORDER — CHLORHEXIDINE GLUCONATE 0.12 % MT SOLN
15.0000 mL | Freq: Once | OROMUCOSAL | Status: AC
  Administered 2019-09-10: 15 mL via OROMUCOSAL

## 2019-09-10 MED ORDER — DOXEPIN HCL 25 MG PO CAPS
25.0000 mg | ORAL_CAPSULE | Freq: Every day | ORAL | Status: DC
  Administered 2019-09-10: 25 mg via ORAL
  Filled 2019-09-10: qty 1

## 2019-09-10 MED ORDER — MENTHOL 3 MG MT LOZG: 1.0000 | LOZENGE | OROMUCOSAL | Status: DC | PRN

## 2019-09-10 MED ORDER — METHOCARBAMOL 500 MG PO TABS
500.0000 mg | ORAL_TABLET | Freq: Four times a day (QID) | ORAL | Status: DC | PRN
  Administered 2019-09-10 – 2019-09-11 (×2): 500 mg via ORAL
  Filled 2019-09-10 (×2): qty 1

## 2019-09-10 MED ORDER — HYDROMORPHONE HCL 1 MG/ML IJ SOLN: 0.5000 mg | INTRAMUSCULAR | Status: DC | PRN

## 2019-09-10 MED ORDER — LACTATED RINGERS IV SOLN: INTRAVENOUS | Status: DC

## 2019-09-10 MED ORDER — PHENYLEPHRINE 40 MCG/ML (10ML) SYRINGE FOR IV PUSH (FOR BLOOD PRESSURE SUPPORT)
PREFILLED_SYRINGE | INTRAVENOUS | Status: DC | PRN
  Administered 2019-09-10 (×3): 120 ug via INTRAVENOUS

## 2019-09-10 MED ORDER — LIDOCAINE 2% (20 MG/ML) 5 ML SYRINGE
INTRAMUSCULAR | Status: DC | PRN
  Administered 2019-09-10: 50 mg via INTRAVENOUS

## 2019-09-10 MED ORDER — CEFAZOLIN SODIUM-DEXTROSE 1-4 GM/50ML-% IV SOLN
1.0000 g | Freq: Four times a day (QID) | INTRAVENOUS | Status: AC
  Administered 2019-09-10 – 2019-09-11 (×2): 1 g via INTRAVENOUS
  Filled 2019-09-10 (×2): qty 50

## 2019-09-10 SURGICAL SUPPLY — 45 items
APL SKNCLS STERI-STRIP NONHPOA (GAUZE/BANDAGES/DRESSINGS)
BAG SPEC THK2 15X12 ZIP CLS (MISCELLANEOUS)
BAG ZIPLOCK 12X15 (MISCELLANEOUS) IMPLANT
BENZOIN TINCTURE PRP APPL 2/3 (GAUZE/BANDAGES/DRESSINGS) IMPLANT
BLADE SAW SGTL 18X1.27X75 (BLADE) ×2 IMPLANT
BLADE SAW SGTL 18X1.27X75MM (BLADE) ×1
CLOSURE WOUND 1/2 X4 (GAUZE/BANDAGES/DRESSINGS)
COVER PERINEAL POST (MISCELLANEOUS) ×3 IMPLANT
COVER SURGICAL LIGHT HANDLE (MISCELLANEOUS) ×3 IMPLANT
COVER WAND RF STERILE (DRAPES) ×3 IMPLANT
CUP SECTOR GRIPTON 50MM (Cup) ×2 IMPLANT
DRAPE STERI IOBAN 125X83 (DRAPES) ×3 IMPLANT
DRAPE U-SHAPE 47X51 STRL (DRAPES) ×6 IMPLANT
DRESSING AQUACEL AG SP 3.5X10 (GAUZE/BANDAGES/DRESSINGS) IMPLANT
DRSG AQUACEL AG ADV 3.5X10 (GAUZE/BANDAGES/DRESSINGS) ×3 IMPLANT
DRSG AQUACEL AG SP 3.5X10 (GAUZE/BANDAGES/DRESSINGS) ×3
DRSG CURAD 3X16 NADH (PACKING) ×2 IMPLANT
DURAPREP 26ML APPLICATOR (WOUND CARE) ×3 IMPLANT
ELECT REM PT RETURN 15FT ADLT (MISCELLANEOUS) ×3 IMPLANT
GAUZE XEROFORM 1X8 LF (GAUZE/BANDAGES/DRESSINGS) ×3 IMPLANT
GLOVE BIO SURGEON STRL SZ7.5 (GLOVE) ×3 IMPLANT
GLOVE BIOGEL PI IND STRL 8 (GLOVE) ×2 IMPLANT
GLOVE BIOGEL PI INDICATOR 8 (GLOVE) ×4
GLOVE ECLIPSE 8.0 STRL XLNG CF (GLOVE) ×3 IMPLANT
GOWN STRL REUS W/TWL XL LVL3 (GOWN DISPOSABLE) ×6 IMPLANT
HANDPIECE INTERPULSE COAX TIP (DISPOSABLE) ×3
HEAD FEM STD 32X+1 STRL (Hips) ×2 IMPLANT
HOLDER FOLEY CATH W/STRAP (MISCELLANEOUS) ×3 IMPLANT
KIT TURNOVER KIT A (KITS) IMPLANT
LINER ACETABULAR 32X50 (Liner) ×2 IMPLANT
PACK ANTERIOR HIP CUSTOM (KITS) ×3 IMPLANT
PENCIL SMOKE EVACUATOR (MISCELLANEOUS) ×2 IMPLANT
SET HNDPC FAN SPRY TIP SCT (DISPOSABLE) ×1 IMPLANT
STAPLER VISISTAT 35W (STAPLE) ×2 IMPLANT
STEM CORAIL KA10 (Stem) ×2 IMPLANT
STRIP CLOSURE SKIN 1/2X4 (GAUZE/BANDAGES/DRESSINGS) IMPLANT
SUT ETHIBOND NAB CT1 #1 30IN (SUTURE) ×3 IMPLANT
SUT ETHILON 2 0 PS N (SUTURE) IMPLANT
SUT MNCRL AB 4-0 PS2 18 (SUTURE) IMPLANT
SUT VIC AB 0 CT1 36 (SUTURE) ×3 IMPLANT
SUT VIC AB 1 CT1 36 (SUTURE) ×3 IMPLANT
SUT VIC AB 2-0 CT1 27 (SUTURE) ×6
SUT VIC AB 2-0 CT1 TAPERPNT 27 (SUTURE) ×2 IMPLANT
TRAY FOLEY MTR SLVR 16FR STAT (SET/KITS/TRAYS/PACK) IMPLANT
YANKAUER SUCT BULB TIP 10FT TU (MISCELLANEOUS) ×3 IMPLANT

## 2019-09-10 NOTE — Brief Op Note (Signed)
09/10/2019  12:18 PM  PATIENT:  Lisa Cabrera  74 y.o. female  PRE-OPERATIVE DIAGNOSIS:  right hip osteoarthritis  POST-OPERATIVE DIAGNOSIS:  right hip osteoarthritis  PROCEDURE:  Procedure(s): RIGHT TOTAL HIP ARTHROPLASTY ANTERIOR APPROACH (Right)  SURGEON:  Surgeon(s) and Role:    Mcarthur Rossetti, MD - Primary  PHYSICIAN ASSISTANT: Benita Stabile, PA-C  ANESTHESIA:   general  EBL:  150 mL   COUNTS:  YES  DICTATION: .Other Dictation: Dictation Number [id number]  PLAN OF CARE: Admit for overnight observation  PATIENT DISPOSITION:  PACU - hemodynamically stable.   Delay start of Pharmacological VTE agent (>24hrs) due to surgical blood loss or risk of bleeding: no

## 2019-09-10 NOTE — Op Note (Signed)
NAME: Lisa Cabrera, Lisa Cabrera MEDICAL RECORD [id number] ACCOUNT [id number] DATE OF BIRTH:04-Jul-1945 FACILITY: [facility] LOCATION: [facility] PHYSICIAN:Johnay Mano Kerry Fort, MD  OPERATIVE REPORT  DATE OF PROCEDURE:  09/10/2019  PREOPERATIVE DIAGNOSIS:  Primary osteoarthritis and degenerative joint disease, right hip.  POSTOPERATIVE DIAGNOSIS:  Primary osteoarthritis and degenerative joint disease, right hip.  PROCEDURE:  Right total hip arthroplasty through direct anterior approach.  IMPLANTS:  DePuy Sector Gription acetabular component size 50, size 32+0 neutral polyethylene liner, size 10 Corail femoral component with standard offset, size 32+1 metal hip ball.  SURGEON:  Lind Guest. Ninfa Linden, MD  ASSISTANT:  Erskine Emery, PA-C  ANESTHESIA:  General.  ANTIBIOTICS:  Two grams IV Ancef.  ESTIMATED BLOOD LOSS:  150 mL.  COMPLICATIONS:  None.  INDICATIONS:  The patient is a very pleasant 74 year old female with debilitating arthritis involving her right hip.  This has been well documented with clinical exam and x-rays.  At this point, her right hip pain is detrimentally affecting her mobility,  her quality of life, and her activities of daily living to the point that she does wish to proceed with a total hip arthroplasty and we agree with this as well.  With this surgery she understands there is the risk of acute blood loss anemia, nerve or  vessel injury, fracture, infection, dislocation, DVT and implant failure.  She understands our goals are to decrease pain, improve mobility and overall improve quality of life.  DESCRIPTION OF PROCEDURE:  After informed consent was obtained and appropriate right hip was marked she was brought to the operating room and general anesthesia was obtained while she was on the stretcher.  Traction boots were placed on both her feet.   She was placed supine on the Hana fracture table with the perineal post in place and both legs in line skeletal traction  devices and no traction applied.  Her right operative hip was prepped and draped with DuraPrep and sterile drapes.  A timeout was  called and she was identified as correct patient, correct right hip.  I then made an incision just inferior and posterior to the anterior superior iliac spine and carried this obliquely down the leg.  We dissected down tensor fascia lata muscle.  Tensor  fascia was then divided longitudinally to proceed with direct anterior approach to the hip.  We identified and cauterized circumflex vessels and identified the hip capsule, opened up the hip capsule in an L-type format finding very large joint effusion.   We placed Cobra retractors around the medial and lateral femoral neck and made our femoral neck cut with an oscillating saw just proximal to the lesser trochanter.  We completed this with an osteotome and placed a corkscrew guide in the femoral head and  removed the femoral head in its entirety finding a wide area devoid of cartilage.  I then placed a bent Hohmann over the medial acetabular rim and removed remnants of the acetabular labrum and other debris.  I then began reaming under direct  visualization from a size 44 reamer in stepwise increments up to a size 49 with all reamers under direct visualization.  The last reamer under direct fluoroscopy, so we could obtain our depth of reaming, our inclination and anteversion.  I then placed  the real DePuy Sector Gription acetabular component size 50 and a 32+0 neutral polyethylene liner for that size acetabular component.  Attention was then turned to the femur.  With the leg externally rotated to 120 degrees, extended and adducted, we are  placing Mueller retractor medially and Hohman retractor behind the greater trochanter, released lateral joint capsule and used a box-cutting osteotome to enter the femoral canal and a rongeur to lateralize.  We then began broaching using the Corail  broaching system from a size 8 going up to  a size 10.  With a size 10 in place, we trialed a 32+1 trial hip ball, reduced this in the acetabulum and we were pleased with leg length, offset, range of motion and stability assessed radiographically and  clinically mechanically.  We dislocated the hip and removed the trial components.  We then placed the real Corail femoral component size 10 with standard offset and the real 32+1 metal hip ball and again reduced this in the acetabulum and it was stable.   We then irrigated the soft tissue with normal saline solution using pulse lavage.  We took final intraoperative x-rays and assessed stability.  We then closed the joint capsule with interrupted #1 Ethibond suture, followed by closing the tensor fascia  with #1 Vicryl.  0 Vicryl was used to close deep tissue, 2-0 Vicryl was used to close the  subcutaneous tissue and staples were used to close the skin.  Xeroform and Aquacel dressing was applied.  She was taken off the Hana table, awakened, extubated,  and taken to recovery room in stable condition.  All final counts were correct.  There were no complications noted.  Note Benita Stabile, PA-C, assisted during the entire case and his assistance was crucial for facilitating all aspects of this case.  CN/NUANCE  D:09/10/2019 T:09/10/2019 JOB:[id number]

## 2019-09-10 NOTE — Evaluation (Signed)
Physical Therapy Evaluation Patient Details Name: Lisa Cabrera MRN: [id number] DOB: Oct 14, 1945 Today's Date: 09/10/2019   History of Present Illness  Patient is 74 y.o. female s/p Rt THA anterior approach on 09/11/19 with PMH significant for anemia, lumbar surgery.  Clinical Impression  Lisa Cabrera is a 74 y.o. female POD 0 s/p Rt THA. Patient reports idependence with mobility at baseline. Patient is now limited by functional impairments (see PT problem list below) and requires min assist for bed mob and transfers with RW. Patient was limited today by symptomatic orthostatic hypotension with sit<>stand transfer and required mod-max assist to return to supine. Patient reported resolution of symptoms in supine and BP stable in supine. Patient will benefit from continued skilled PT interventions to address impairments and progress towards PLOF. Acute PT will follow to progress mobility and stair training in preparation for safe discharge home.   Vitals Sitting                                     BP= 119/67    HR= 81 Standing                                 BP= 69/42      HR= 75 Supine (trendelenburg)       BP= 128/65    HR= 75    Follow Up Recommendations Follow surgeon's recommendation for DC plan and follow-up therapies;Home health PT    Equipment Recommendations  Rolling walker with 5" wheels;3in1 (PT) (delivered to room on 09/10/19)    Recommendations for Other Services       Precautions / Restrictions Precautions Precautions: Fall Restrictions Weight Bearing Restrictions: No Other Position/Activity Restrictions: WBAT      Mobility  Bed Mobility Overal bed mobility: Needs Assistance Bed Mobility: Supine to Sit;Sit to Supine     Supine to sit: Min assist;HOB elevated Sit to supine: Mod assist;Max assist   General bed mobility comments: cues for sequencing and use of bed rail, assist for Rt LE mobility.  Transfers Overall transfer level: Needs assistance Equipment  used: Rolling walker (2 wheeled) Transfers: Sit to/from Stand Sit to Stand: Min assist         General transfer comment: cues for hand placement/technique, light assist to rise with power up. pt c/o increased"woozines" and nausea in standing. pt found to be hypotensive and mod-max assist required to return to supine.   Ambulation/Gait                Stairs            Wheelchair Mobility    Modified Rankin (Stroke Patients Only)       Balance Overall balance assessment: Needs assistance Sitting-balance support: Feet supported Sitting balance-Leahy Scale: Good     Standing balance support: Bilateral upper extremity supported Standing balance-Leahy Scale: Poor              Pertinent Vitals/Pain Pain Assessment: 0-10 Pain Score: 2  Pain Location: Rt hip Pain Descriptors / Indicators: Discomfort Pain Intervention(s): Limited activity within patient's tolerance;Monitored during session;Repositioned;Ice applied    Home Living Family/patient expects to be discharged to:: Private residence Living Arrangements: Spouse/significant other Available Help at Discharge: Family Type of Home: House Home Access: Stairs to enter Entrance Stairs-Rails: Right;Left;None Entrance Stairs-Number of Steps: 1 at the back no rail, 6 at  front rails Home Layout: Two level;Full bath on main level;Bed/bath upstairs;Able to live on main level with bedroom/bathroom Home Equipment: Grab bars - tub/shower Additional Comments: pt is planning to stay on first floor initially in guest bedroom    Prior Function Level of Independence: Independent               Hand Dominance   Dominant Hand: Right    Extremity/Trunk Assessment   Upper Extremity Assessment Upper Extremity Assessment: Overall WFL for tasks assessed    Lower Extremity Assessment Lower Extremity Assessment: Overall WFL for tasks assessed    Cervical / Trunk Assessment Cervical / Trunk Assessment: Normal   Communication   Communication: No difficulties  Cognition Arousal/Alertness: Awake/alert Behavior During Therapy: WFL for tasks assessed/performed Overall Cognitive Status: Within Functional Limits for tasks assessed               General Comments General comments (skin integrity, edema, etc.): BP assessed in sitting EOB where pt c/o some "wooziness". BP was 119/67 wiht HR of 81 bpm. After standing BP found to be 69/42 and HR 75, pt noted to be diaphoretic and pale. After return to supine and trendelenburg position BP 128/65 with HR of 75 bpm. Pt reported improved symptoms in supine.     Exercises     Assessment/Plan    PT Assessment Patient needs continued PT services  PT Problem List Decreased strength;Decreased activity tolerance;Decreased range of motion;Decreased balance;Decreased mobility;Decreased knowledge of use of DME;Decreased knowledge of precautions       PT Treatment Interventions DME instruction;Gait training;Stair training;Functional mobility training;Therapeutic activities;Therapeutic exercise;Balance training;Patient/family education    PT Goals (Current goals can be found in the Care Plan section)  Acute Rehab PT Goals Patient Stated Goal: recoer and get independent again PT Goal Formulation: With patient Time For Goal Achievement: 09/17/19 Potential to Achieve Goals: Good    Frequency 7X/week    AM-PAC PT "6 Clicks" Mobility  Outcome Measure Help needed turning from your back to your side while in a flat bed without using bedrails?: A Little Help needed moving from lying on your back to sitting on the side of a flat bed without using bedrails?: A Little Help needed moving to and from a bed to a chair (including a wheelchair)?: A Little Help needed standing up from a chair using your arms (e.g., wheelchair or bedside chair)?: A Little Help needed to walk in hospital room?: A Lot Help needed climbing 3-5 steps with a railing? : A Lot 6 Click Score:  16    End of Session Equipment Utilized During Treatment: Gait belt Activity Tolerance: Patient tolerated treatment well Patient left: in bed;with call bell/phone within reach;with bed alarm set Nurse Communication: Mobility status PT Visit Diagnosis: Muscle weakness (generalized) (M62.81);Difficulty in walking, not elsewhere classified (R26.2)    Time: 5809-9833 PT Time Calculation (min) (ACUTE ONLY): 23 min   Charges:   PT Evaluation $PT Eval Low Complexity: 1 Low          Verner Mould, DPT [place]  Office [phone] Pager [phone]  09/10/2019 5:05 PM

## 2019-09-10 NOTE — Care Plan (Signed)
Ortho Bundle Case Management Note  Patient Details  Name: Lisa Cabrera MRN: [id number] Date of Birth: 1945/07/24  Spoke with patient prior to surgery for a R-THA with Dr. Ninfa Linden on 09/10/19. Patient is an ortho bundle patient through [facility].  She has a spouse that will be able to assist her at home after discharge. She will need a FWW and 3in1. Referral made to DME agency [facility]). Anticipate HHPT will be needed after short hospital stay. Choice provided and referral to [facility]. Reviewed all post-op care instructions and provided opportunity for patient to ask questions. Will continue to follow for all needs.          DME Arranged:  3-N-1, Walker rolling DME Agency:  [facility]  HH Arranged:  PT [place] Agency:  [facility] (now [facility])  Additional Comments: Please contact me with any questions of if this plan should need to change.  Jamse Arn, RN, BSN, [employer]  [phone] 09/10/2019, 11:01 AM

## 2019-09-10 NOTE — Anesthesia Postprocedure Evaluation (Signed)
Anesthesia Post Note  Patient: Lisa Cabrera  Procedure(s) Performed: RIGHT TOTAL HIP ARTHROPLASTY ANTERIOR APPROACH (Right Hip)     Patient location during evaluation: PACU Anesthesia Type: General Level of consciousness: awake and alert Pain management: pain level controlled Vital Signs Assessment: post-procedure vital signs reviewed and stable Respiratory status: spontaneous breathing, nonlabored ventilation and respiratory function stable Cardiovascular status: blood pressure returned to baseline and stable Postop Assessment: no apparent nausea or vomiting Anesthetic complications: no   No complications documented.  Last Vitals:  Vitals:   09/10/19 1300 09/10/19 1315  BP: (!) 158/76 (!) 160/65  Pulse: (!) 106 87  Resp: 14 11  Temp:    SpO2: 100% 100%    Last Pain:  Vitals:   09/10/19 0851  TempSrc:   PainSc: 0-No pain                 Catalina Gravel

## 2019-09-10 NOTE — Interval H&P Note (Signed)
History and Physical Interval Note: The patient comes in today for a scheduled right total hip arthroplasty.  She is fully aware of the risk and benefits of the surgery.  There is been no acute change in her medical status.  See previous H&P.  We had a long thorough discussion about what to expect from surgery.  Informed consent is obtained in the right hip is been marked.  09/10/2019 9:51 AM  Lisa Cabrera  has presented today for surgery, with the diagnosis of right hip osteoarthritis.  The various methods of treatment have been discussed with the patient and family. After consideration of risks, benefits and other options for treatment, the patient has consented to  Procedure(s): RIGHT TOTAL HIP ARTHROPLASTY ANTERIOR APPROACH (Right) as a surgical intervention.  The patient's history has been reviewed, patient examined, no change in status, stable for surgery.  I have reviewed the patient's chart and labs.  Questions were answered to the patient's satisfaction.     Mcarthur Rossetti

## 2019-09-10 NOTE — Transfer of Care (Signed)
Immediate Anesthesia Transfer of Care Note  Patient: Lisa Cabrera  Procedure(s) Performed: RIGHT TOTAL HIP ARTHROPLASTY ANTERIOR APPROACH (Right Hip)  Patient Location: PACU  Anesthesia Type:General  Level of Consciousness: awake, alert  and oriented  Airway & Oxygen Therapy: Patient Spontanous Breathing and Patient connected to face mask oxygen  Post-op Assessment: Report given to RN, Post -op Vital signs reviewed and stable and Patient moving all extremities X 4  Post vital signs: Reviewed and stable  Last Vitals:  Vitals Value Taken Time  BP 149/65 09/10/19 1245  Temp    Pulse 94 09/10/19 1245  Resp 15 09/10/19 1246  SpO2 100 % 09/10/19 1245  Vitals shown include unvalidated device data.  Last Pain:  Vitals:   09/10/19 0851  TempSrc:   PainSc: 0-No pain      Patients Stated Pain Goal: 3 (91/50/56 [id number])  Complications: No complications documented.

## 2019-09-10 NOTE — Anesthesia Procedure Notes (Signed)
Procedure Name: Intubation Date/Time: 09/10/2019 11:12 AM Performed by: Niel Hummer, CRNA Pre-anesthesia Checklist: Patient identified, Emergency Drugs available, Suction available and Patient being monitored Patient Re-evaluated:Patient Re-evaluated prior to induction Oxygen Delivery Method: Circle system utilized Preoxygenation: Pre-oxygenation with 100% oxygen Induction Type: IV induction Ventilation: Mask ventilation without difficulty Laryngoscope Size: Mac Grade View: Grade I Tube type: Oral Tube size: 7.0 mm Number of attempts: 1 Airway Equipment and Method: Stylet Placement Confirmation: ETT inserted through vocal cords under direct vision,  positive ETCO2 and breath sounds checked- equal and bilateral Secured at: 22 cm Tube secured with: Tape Dental Injury: Teeth and Oropharynx as per pre-operative assessment

## 2019-09-10 NOTE — Progress Notes (Signed)
PHARMACIST - PHYSICIAN ORDER COMMUNICATION  CONCERNING: P&T Medication Policy on Herbal Medications  DESCRIPTION:  This patient's order for: Biotin has been noted.  This product(s) is classified as an "herbal" or natural product. Due to a lack of definitive safety studies or FDA approval, nonstandard manufacturing practices, plus the potential risk of unknown drug-drug interactions while on inpatient medications, the Pharmacy and Therapeutics Committee does not permit the use of "herbal" or natural products of this type within [facility].   ACTION TAKEN: The pharmacy department is unable to verify this order at this time and your patient has been informed of this safety policy. Please reevaluate patient's clinical condition at discharge and address if the herbal or natural product(s) should be resumed at that time.   Lindell Spar, PharmD, BCPS Clinical Pharmacist 09/10/2019 2:11 PM

## 2019-09-11 DIAGNOSIS — M1611 Unilateral primary osteoarthritis, right hip: Secondary | ICD-10-CM | POA: Diagnosis not present

## 2019-09-11 LAB — BASIC METABOLIC PANEL
Anion gap: 6 (ref 5–15)
BUN: 19 mg/dL (ref 8–23)
CO2: 26 mmol/L (ref 22–32)
Calcium: 7.9 mg/dL — ABNORMAL LOW (ref 8.9–10.3)
Chloride: 104 mmol/L (ref 98–111)
Creatinine, Ser: 1.05 mg/dL — ABNORMAL HIGH (ref 0.44–1.00)
GFR calc Af Amer: >60 mL/min (ref >60)
GFR calc non Af Amer: 53 mL/min — ABNORMAL LOW (ref >60)
Glucose, Bld: 162 mg/dL — ABNORMAL HIGH (ref 70–99)
Potassium: 4.5 mmol/L (ref 3.5–5.1)
Sodium: 136 mmol/L (ref 135–145)

## 2019-09-11 LAB — CBC
HCT: 30.7 % — ABNORMAL LOW (ref 36.0–46.0)
Hemoglobin: 10.2 g/dL — ABNORMAL LOW (ref 12.0–15.0)
MCH: 32 pg (ref 26.0–34.0)
MCHC: 33.2 g/dL (ref 30.0–36.0)
MCV: 96.2 fL (ref 80.0–100.0)
Platelets: 220 10*3/uL (ref 150–400)
RBC: 3.19 MIL/uL — ABNORMAL LOW (ref 3.87–5.11)
RDW: 12.8 % (ref 11.5–15.5)
WBC: 11.3 10*3/uL — ABNORMAL HIGH (ref 4.0–10.5)
nRBC: 0 % (ref 0.0–0.2)

## 2019-09-11 MED ORDER — TIZANIDINE HCL 4 MG PO TABS: 4.0000 mg | ORAL_TABLET | Freq: Three times a day (TID) | ORAL | 0 refills | Status: DC | PRN

## 2019-09-11 MED ORDER — ASPIRIN 81 MG PO CHEW: 81.0000 mg | CHEWABLE_TABLET | Freq: Two times a day (BID) | ORAL | 0 refills | Status: DC

## 2019-09-11 MED ORDER — HYDROCODONE-ACETAMINOPHEN 5-325 MG PO TABS: 1.0000 | ORAL_TABLET | Freq: Four times a day (QID) | ORAL | 0 refills | Status: DC | PRN

## 2019-09-11 NOTE — Discharge Summary (Signed)
Patient ID: Lisa Cabrera MRN: [id number] DOB/AGE: 73-Jul-1947 73 y.o.  Admit date: 09/10/2019 Discharge date: 09/11/2019  Admission Diagnoses:  Principal Problem:   Unilateral primary osteoarthritis, right hip Active Problems:   Status post total replacement of right hip   Discharge Diagnoses:  Same  Past Medical History:  Diagnosis Date  . Anemia    normocytic  . Complication of anesthesia    HAD RAPID HEART BEAT AFTER ANESTHIA  . Family history of anesthesia complication    Father had difficulty waking up  . H/O seasonal allergies   . Heart murmur     Surgeries: Procedure(s): RIGHT TOTAL HIP ARTHROPLASTY ANTERIOR APPROACH on 09/10/2019   Consultants:   Discharged Condition: Improved  Hospital Course: Lisa Cabrera is an 74 y.o. female who was admitted 09/10/2019 for operative treatment ofUnilateral primary osteoarthritis, right hip. Patient has severe unremitting pain that affects sleep, daily activities, and work/hobbies. After pre-op clearance the patient was taken to the operating room on 09/10/2019 and underwent  Procedure(s): RIGHT TOTAL HIP ARTHROPLASTY ANTERIOR APPROACH.    Patient was given perioperative antibiotics:  Anti-infectives (From admission, onward)   Start     Dose/Rate Route Frequency Ordered Stop   09/10/19 1730  ceFAZolin (ANCEF) IVPB 1 g/50 mL premix        1 g 100 mL/hr over 30 Minutes Intravenous Every 6 hours 09/10/19 1357 09/11/19 0048   09/10/19 0845  ceFAZolin (ANCEF) IVPB 2g/100 mL premix        2 g 200 mL/hr over 30 Minutes Intravenous On call to O.R. 09/10/19 0831 09/10/19 1112       Patient was given sequential compression devices, early ambulation, and chemoprophylaxis to prevent DVT.  Patient benefited maximally from hospital stay and there were no complications.    Recent vital signs:  Patient Vitals for the past 24 hrs:  BP Temp Temp src Pulse Resp SpO2 Height Weight  09/11/19 0551 120/69 98.1 F (36.7 C) -- 93 16 99 %  -- --  09/11/19 0122 129/75 98.5 F (36.9 C) -- (!) 102 15 99 % -- --  09/10/19 2154 (!) 149/78 98 F (36.7 C) -- 97 15 99 % -- --  09/10/19 1956 140/82 98.1 F (36.7 C) -- 95 15 100 % -- --  09/10/19 1634 119/67 98.3 F (36.8 C) -- 83 17 97 % -- --  09/10/19 1546 (!) 149/74 98 F (36.7 C) -- 83 17 100 % -- --  09/10/19 1454 135/69 98 F (36.7 C) -- 85 17 100 % -- --  09/10/19 1350 (!) 144/66 (!) 97.5 F (36.4 C) Oral 85 13 100 % 5\' 1"  (1.549 m) 54.9 kg  09/10/19 1315 (!) 160/65 -- -- 87 11 100 % -- --  09/10/19 1300 (!) 158/76 -- -- (!) 106 14 100 % -- --  09/10/19 1245 (!) 149/65 97.6 F (36.4 C) -- 94 -- 100 % -- --  09/10/19 0851 -- -- -- -- -- -- 5\' 1"  (1.549 m) 54.9 kg  09/10/19 0841 (!) 165/78 98.7 F (37.1 C) Oral 93 16 98 % -- --     Recent laboratory studies:  Recent Labs    09/11/19 0301  WBC 11.3*  HGB 10.2*  HCT 30.7*  PLT 220  NA 136  K 4.5  CL 104  CO2 26  BUN 19  CREATININE 1.05*  GLUCOSE 162*  CALCIUM 7.9*     Discharge Medications:   Allergies as of 09/11/2019   No Known  Allergies     Medication List    STOP taking these medications   aspirin 81 MG tablet Replaced by: aspirin 81 MG chewable tablet     TAKE these medications   aspirin 81 MG chewable tablet Chew 1 tablet (81 mg total) by mouth 2 (two) times daily. Replaces: aspirin 81 MG tablet   Biotin 10000 MCG Tabs Take 10,000 mcg by mouth daily.   calcium gluconate 500 MG tablet Take 2 tablets by mouth daily.   doxepin 25 MG capsule Commonly known as: SINEQUAN Take 25 mg by mouth daily.   FISH OIL PO Take 1 capsule by mouth daily.   fluticasone 50 MCG/ACT nasal spray Commonly known as: FLONASE Place 1 spray into both nostrils at bedtime.   HYDROcodone-acetaminophen 5-325 MG tablet Commonly known as: Norco Take 1-2 tablets by mouth every 6 (six) hours as needed for moderate pain.   metroNIDAZOLE 1 % gel Commonly known as: METROGEL Apply 1 application topically daily.  ROSACEA   MULTIVITAMIN PO Take 1 tablet by mouth daily.   Opcon-A 0.027-0.315 % Soln Generic drug: Naphazoline-Pheniramine Place 1 drop into both eyes in the morning and at bedtime.   pravastatin 20 MG tablet Commonly known as: PRAVACHOL Take 20 mg by mouth daily.   tiZANidine 4 MG tablet Commonly known as: ZANAFLEX Take 1 tablet (4 mg total) by mouth every 8 (eight) hours as needed.   verapamil 240 MG CR tablet Commonly known as: CALAN-SR Take 240 mg by mouth daily.   Vitamin D 50 MCG (2000 UT) Caps Take 2,000 Units by mouth daily.            Durable Medical Equipment  (From admission, onward)         Start     Ordered   09/10/19 1358  DME 3 n 1  Once        09/10/19 1357   09/10/19 1358  DME Walker rolling  Once       Question Answer Comment  Walker: With 5 Inch Wheels   Patient needs a walker to treat with the following condition Status post revision of total replacement of right knee      09/10/19 1357   09/10/19 1058  For home use only DME 3 n 1  Once       Comments: [employer] contacted to provide in hospital prior to discharge   09/10/19 1100   09/10/19 1058  For home use only DME Walker rolling  Once       Comments: [employer] contacted to provide in hospital prior to discharge.  Question Answer Comment  Walker: With 5 Inch Wheels   Patient needs a walker to treat with the following condition S/P total hip arthroplasty      09/10/19 1100          Diagnostic Studies: DG Pelvis Portable  Result Date: 09/10/2019 CLINICAL DATA:  Right hip replacement. EXAM: PORTABLE PELVIS 1-2 VIEWS COMPARISON:  Right x-rays dated Aug 03, 2019. FINDINGS: The right hip demonstrates a total arthroplasty without evidence of hardware failure or complication. There is no fracture or dislocation. The alignment is anatomic. Post-surgical changes and subcutaneous emphysema noted in the surrounding soft tissues. Moderate left hip osteoarthritis. IMPRESSION: 1. Right total hip arthroplasty  without acute postoperative complication. Electronically Signed   By: Titus Dubin M.D.   On: 09/10/2019 15:32   DG C-Arm 1-60 Min-No Report  Result Date: 09/10/2019 Fluoroscopy was utilized by the requesting physician.  No radiographic interpretation.  DG HIP OPERATIVE UNILAT W OR W/O PELVIS RIGHT  Result Date: 09/10/2019 CLINICAL DATA:  Right total hip replacement. EXAM: OPERATIVE RIGHT  HIP (WITH PELVIS IF PERFORMED) TECHNIQUE: Fluoroscopic spot image(s) were submitted for interpretation post-operatively. COMPARISON:  Right hip x-rays dated Aug 03, 2019. FINDINGS: Multiple intraoperative fluoroscopic images demonstrate interval right total hip arthroplasty. Components are well aligned. No acute osseous abnormality. IMPRESSION: Intraoperative fluoroscopic guidance for right total hip arthroplasty. Electronically Signed   By: Titus Dubin M.D.   On: 09/10/2019 15:30    Disposition: Discharge disposition: 01-Home or Self Care          Follow-up Information    Mcarthur Rossetti, MD. Go on 09/23/2019.   Specialty: Orthopedic Surgery Why: at 1:30 pm for your in office post-op appointment with Dr. Clarita Leber information: [place] [id number] [phone]        Home, [facility] Follow up.   Specialty: Home Health Services Why: Someone from the home health agency will be in contact with you after discharge from the hospital to arrange your first in home physical therapy appointment. Contact information: [address] [place] [address] [phone]                Signed: Mcarthur Rossetti 09/11/2019, 8:12 AM

## 2019-09-11 NOTE — Discharge Instructions (Signed)

## 2019-09-11 NOTE — Progress Notes (Signed)
Physical Therapy Treatment Patient Details Name: Lisa Cabrera MRN: [id number] DOB: 1945/08/06 Today's Date: 09/11/2019    History of Present Illness Patient is 74 y.o. female s/p Rt THA anterior approach on 09/11/19 with PMH significant for anemia, lumbar surgery.    PT Comments    Pt with improved ambulation distance this session with proficiency demonstrated during stair navigation. Pt practiced steps x2, with PT assist then with husband assist, and pt and husband feel comfortable with navigating steps upon d/c home. PT demonstrated, practiced, and administered handout of THA HEP, pt with no further questions and ready for d/c home from a mobility standpoint.    Follow Up Recommendations  Follow surgeons recommendation for DC plan and follow-up therapies;Home health PT     Equipment Recommendations  Rolling walker with 5" wheels;3in1 (PT) (delivered to room on 09/10/19)    Recommendations for Other Services       Precautions / Restrictions Precautions Precautions: Fall Restrictions Weight Bearing Restrictions: No Other Position/Activity Restrictions: WBAT    Mobility  Bed Mobility Overal bed mobility: Needs Assistance Bed Mobility: Supine to Sit     Supine to sit: Supervision     General bed mobility comments: for safety, increased time and effort to perform.  Transfers Overall transfer level: Needs assistance Equipment used: Rolling walker (2 wheeled) Transfers: Sit to/from Stand Sit to Stand: Supervision         General transfer comment: for safety, good hand placement utilized when rising to stand  Ambulation/Gait Ambulation/Gait assistance: Supervision Gait Distance (Feet): 100 Feet Assistive device: Rolling walker (2 wheeled) Gait Pattern/deviations: Step-to pattern;Step-through pattern;Decreased stride length;Trunk flexed Gait velocity: decr   General Gait Details: supervision for safety, verbal cuing for upright posture and placement in RW  x1.   Stairs Stairs: Yes Stairs assistance: Min guard Stair Management: Forwards;Step to pattern;No rails (with HHA) Number of Stairs: 3 General stair comments: min guard for safety, HHA provided for pt self-steadying. Verbal cuing for sequencing (ascending with LLE leading, descending with RLE leading), caregiver positioning for safety during stair navigation. Stairs practiced x1 with PT assisting and x1 with husband assisting.   Wheelchair Mobility    Modified Rankin (Stroke Patients Only)       Balance Overall balance assessment: Needs assistance Sitting-balance support: Feet supported Sitting balance-Leahy Scale: Good     Standing balance support: Bilateral upper extremity supported Standing balance-Leahy Scale: Fair                              Cognition Arousal/Alertness: Awake/alert Behavior During Therapy: WFL for tasks assessed/performed Overall Cognitive Status: Within Functional Limits for tasks assessed                                        Exercises Total Joint Exercises Ankle Circles/Pumps: AROM;Both;5 reps;Supine Quad Sets: AROM;Right;5 reps;Supine Heel Slides: AAROM;Right;5 reps;Supine Hip ABduction/ADduction: AAROM;Right;5 reps;Supine;Standing Long Arc Quad: AROM;5 reps;Right;Seated Knee Flexion: AROM;Right;5 reps;Standing Marching in Standing: AROM;Right;5 reps;Standing    General Comments        Pertinent Vitals/Pain Pain Assessment: 0-10 Pain Score: 2  Pain Location: Right hip, with mobility Pain Descriptors / Indicators: Discomfort Pain Intervention(s): Monitored during session;Repositioned;Limited activity within patient's tolerance;Premedicated before session    Home Living  Prior Function            PT Goals (current goals can now be found in the care plan section) Acute Rehab PT Goals Patient Stated Goal: walk with my husband PT Goal Formulation: With patient Time For  Goal Achievement: 09/17/19 Potential to Achieve Goals: Good Progress towards PT goals: Progressing toward goals    Frequency    7X/week      PT Plan Current plan remains appropriate    Co-evaluation              AM-PAC PT "6 Clicks" Mobility   Outcome Measure  Help needed turning from your back to your side while in a flat bed without using bedrails?: A Little Help needed moving from lying on your back to sitting on the side of a flat bed without using bedrails?: A Little Help needed moving to and from a bed to a chair (including a wheelchair)?: A Little Help needed standing up from a chair using your arms (e.g., wheelchair or bedside chair)?: A Little Help needed to walk in hospital room?: A Little Help needed climbing 3-5 steps with a railing? : A Little 6 Click Score: 18    End of Session Equipment Utilized During Treatment: Gait belt Activity Tolerance: Patient tolerated treatment well Patient left: in bed;with call bell/phone within reach;with bed alarm set Nurse Communication: Mobility status PT Visit Diagnosis: Muscle weakness (generalized) (M62.81);Difficulty in walking, not elsewhere classified (R26.2)     Time: 2130-8657 PT Time Calculation (min) (ACUTE ONLY): 24 min  Charges:  $Gait Training: 8-22 mins $Therapeutic Exercise: 8-22 mins                     Souleymane Saiki E, PT Acute Rehabilitation Services Pager [phone]  Office [phone]  Felix Meras D Elonda Husky 09/11/2019, 4:02 PM

## 2019-09-11 NOTE — Plan of Care (Signed)
°  Problem: Education: Goal: Knowledge of the prescribed therapeutic regimen will improve Outcome: Progressing Goal: Understanding of discharge needs will improve Outcome: Progressing Goal: Individualized Educational Video(s) Outcome: Progressing   Problem: Pain Management: Goal: Pain level will decrease with appropriate interventions Outcome: Progressing   Problem: Skin Integrity: Goal: Will show signs of wound healing Outcome: Progressing

## 2019-09-11 NOTE — Progress Notes (Signed)
Subjective: 1 Day Post-Op Procedure(s) (LRB): RIGHT TOTAL HIP ARTHROPLASTY ANTERIOR APPROACH (Right) Patient reports pain as moderate.    Objective: Vital signs in last 24 hours: Temp:  [97.5 F (36.4 C)-98.7 F (37.1 C)] 98.1 F (36.7 C) (06/12 0551) Pulse Rate:  [83-106] 93 (06/12 0551) Resp:  [11-17] 16 (06/12 0551) BP: (119-165)/(65-82) 120/69 (06/12 0551) SpO2:  [97 %-100 %] 99 % (06/12 0551) Weight:  [54.9 kg] 54.9 kg (06/11 1350)  Intake/Output from previous day: 06/11 0701 - 06/12 0700 In: 2439.7 [P.O.:120; I.V.:2019.7; IV Piggyback:300] Out: 500 [Urine:350; Blood:150] Intake/Output this shift: No intake/output data recorded.  Recent Labs    09/11/19 0301  HGB 10.2*   Recent Labs    09/11/19 0301  WBC 11.3*  RBC 3.19*  HCT 30.7*  PLT 220   Recent Labs    09/11/19 0301  NA 136  K 4.5  CL 104  CO2 26  BUN 19  CREATININE 1.05*  GLUCOSE 162*  CALCIUM 7.9*   No results for input(s): LABPT, INR in the last 72 hours.  Sensation intact distally Intact pulses distally Dorsiflexion/Plantar flexion intact Incision: dressing C/D/I No cellulitis present Compartment soft   Assessment/Plan: 1 Day Post-Op Procedure(s) (LRB): RIGHT TOTAL HIP ARTHROPLASTY ANTERIOR APPROACH (Right) Up with therapy Discharge home with home health this afternoon if felling well.    Patient's anticipated LOS is less than 2 midnights, meeting these requirements: - Younger than 74 - Lives within 1 hour of care - Has a competent adult at home to recover with post-op recover - NO history of  - Chronic pain requiring opiods  - Diabetes  - Coronary Artery Disease  - Heart failure  - Heart attack  - Stroke  - DVT/VTE  - Cardiac arrhythmia  - Respiratory Failure/COPD  - Renal failure  - Anemia  - Advanced Liver disease       Mcarthur Rossetti 09/11/2019, 8:09 AM

## 2019-09-11 NOTE — Progress Notes (Signed)
Physical Therapy Treatment Patient Details Name: Lisa Cabrera MRN: [id number] DOB: [birth year] Today's Date: 09/11/2019    History of Present Illness Patient is 74 y.o. female s/p Rt THA anterior approach on 09/11/19 with PMH significant for anemia, lumbar surgery.    PT Comments    Pt mobilizing well on POD1, ambulating hallway distance with min verbal cuing for form and safety. Pt also tolerated initiation of THA HEP. Pt with no hypotension or s/s of orthostatics this session. PT to see pt for second session to address stair training and completion of HEP.    Follow Up Recommendations  Follow surgeons recommendation for DC plan and follow-up therapies;Home health PT     Equipment Recommendations  Rolling walker with 5" wheels;3in1 (PT) (delivered to room on 09/10/19)    Recommendations for Other Services       Precautions / Restrictions Precautions Precautions: Fall Restrictions Weight Bearing Restrictions: No Other Position/Activity Restrictions: WBAT    Mobility  Bed Mobility Overal bed mobility: Needs Assistance Bed Mobility: Supine to Sit     Supine to sit: Min guard;HOB elevated     General bed mobility comments: for safety, HOB elevated and use of bedrails to come to sitting.  Transfers Overall transfer level: Needs assistance Equipment used: Rolling walker (2 wheeled) Transfers: Sit to/from Stand Sit to Stand: Supervision         General transfer comment: for safety, verbal cuing for hand placement when rising.  Ambulation/Gait Ambulation/Gait assistance: Min guard;Supervision Gait Distance (Feet): 75 Feet Assistive device: Rolling walker (2 wheeled) Gait Pattern/deviations: Step-to pattern;Step-through pattern;Decreased stride length;Trunk flexed Gait velocity: decr   General Gait Details: min guard initially, transitioning to supervision for safety. Verbal cuing for sequencing, placement in RW, upright posture as pt tends to forward flex at  hips.   Stairs             Wheelchair Mobility    Modified Rankin (Stroke Patients Only)       Balance Overall balance assessment: Needs assistance Sitting-balance support: Feet supported Sitting balance-Leahy Scale: Good     Standing balance support: Bilateral upper extremity supported Standing balance-Leahy Scale: Fair                              Cognition Arousal/Alertness: Awake/alert Behavior During Therapy: WFL for tasks assessed/performed Overall Cognitive Status: Within Functional Limits for tasks assessed                                        Exercises Total Joint Exercises Ankle Circles/Pumps: AROM;Both;10 reps;Seated Quad Sets: AROM;Right;10 reps;Seated    General Comments General comments (skin integrity, edema, etc.): BP WFL post supine>sit, pt with no dizziness, diaphoresis, pallor with mobility      Pertinent Vitals/Pain Pain Assessment: 0-10 Pain Score: 4  Pain Location: Right hip, with mobility Pain Descriptors / Indicators: Discomfort Pain Intervention(s): Limited activity within patient's tolerance;Monitored during session;Repositioned;Other (comment) (RN bringing pain meds at end of session)    Home Living                      Prior Function            PT Goals (current goals can now be found in the care plan section) Acute Rehab PT Goals Patient Stated Goal: walk with my husband PT Goal Formulation:  With patient Time For Goal Achievement: 09/17/19 Potential to Achieve Goals: Good Progress towards PT goals: Progressing toward goals    Frequency    7X/week      PT Plan Current plan remains appropriate    Co-evaluation              AM-PAC PT "6 Clicks" Mobility   Outcome Measure  Help needed turning from your back to your side while in a flat bed without using bedrails?: A Little Help needed moving from lying on your back to sitting on the side of a flat bed without using  bedrails?: A Little Help needed moving to and from a bed to a chair (including a wheelchair)?: A Little Help needed standing up from a chair using your arms (e.g., wheelchair or bedside chair)?: A Little Help needed to walk in hospital room?: A Little Help needed climbing 3-5 steps with a railing? : A Little 6 Click Score: 18    End of Session Equipment Utilized During Treatment: Gait belt Activity Tolerance: Patient tolerated treatment well Patient left: in bed;with call bell/phone within reach;with bed alarm set Nurse Communication: Mobility status PT Visit Diagnosis: Muscle weakness (generalized) (M62.81);Difficulty in walking, not elsewhere classified (R26.2)     Time: 2585-2778 PT Time Calculation (min) (ACUTE ONLY): 19 min  Charges:  $Gait Training: 8-22 mins                     Neil Errickson E, PT [place] Pager [phone]  Office [phone]    Nathanel Tallman D [place] 09/11/2019, 11:14 AM

## 2019-09-12 DIAGNOSIS — J302 Other seasonal allergic rhinitis: Secondary | ICD-10-CM | POA: Diagnosis not present

## 2019-09-12 DIAGNOSIS — Z471 Aftercare following joint replacement surgery: Secondary | ICD-10-CM | POA: Diagnosis not present

## 2019-09-12 DIAGNOSIS — Z96641 Presence of right artificial hip joint: Secondary | ICD-10-CM | POA: Diagnosis not present

## 2019-09-12 DIAGNOSIS — Z7982 Long term (current) use of aspirin: Secondary | ICD-10-CM | POA: Diagnosis not present

## 2019-09-12 DIAGNOSIS — D649 Anemia, unspecified: Secondary | ICD-10-CM | POA: Diagnosis not present

## 2019-09-12 DIAGNOSIS — I058 Other rheumatic mitral valve diseases: Secondary | ICD-10-CM | POA: Diagnosis not present

## 2019-09-13 ENCOUNTER — Encounter (HOSPITAL_COMMUNITY): Payer: Self-pay | Admitting: Orthopaedic Surgery

## 2019-09-13 ENCOUNTER — Telehealth: Payer: Self-pay | Admitting: *Deleted

## 2019-09-13 NOTE — Telephone Encounter (Signed)
Ortho bundle Discharge call completed.

## 2019-09-14 DIAGNOSIS — Z96641 Presence of right artificial hip joint: Secondary | ICD-10-CM | POA: Diagnosis not present

## 2019-09-14 DIAGNOSIS — I058 Other rheumatic mitral valve diseases: Secondary | ICD-10-CM | POA: Diagnosis not present

## 2019-09-14 DIAGNOSIS — Z471 Aftercare following joint replacement surgery: Secondary | ICD-10-CM | POA: Diagnosis not present

## 2019-09-14 DIAGNOSIS — J302 Other seasonal allergic rhinitis: Secondary | ICD-10-CM | POA: Diagnosis not present

## 2019-09-14 DIAGNOSIS — Z7982 Long term (current) use of aspirin: Secondary | ICD-10-CM | POA: Diagnosis not present

## 2019-09-14 DIAGNOSIS — D649 Anemia, unspecified: Secondary | ICD-10-CM | POA: Diagnosis not present

## 2019-09-16 DIAGNOSIS — Z471 Aftercare following joint replacement surgery: Secondary | ICD-10-CM | POA: Diagnosis not present

## 2019-09-16 DIAGNOSIS — Z96641 Presence of right artificial hip joint: Secondary | ICD-10-CM | POA: Diagnosis not present

## 2019-09-16 DIAGNOSIS — J302 Other seasonal allergic rhinitis: Secondary | ICD-10-CM | POA: Diagnosis not present

## 2019-09-16 DIAGNOSIS — Z7982 Long term (current) use of aspirin: Secondary | ICD-10-CM | POA: Diagnosis not present

## 2019-09-16 DIAGNOSIS — D649 Anemia, unspecified: Secondary | ICD-10-CM | POA: Diagnosis not present

## 2019-09-16 DIAGNOSIS — I058 Other rheumatic mitral valve diseases: Secondary | ICD-10-CM | POA: Diagnosis not present

## 2019-09-17 ENCOUNTER — Telehealth: Payer: Self-pay | Admitting: *Deleted

## 2019-09-17 NOTE — Telephone Encounter (Signed)
Ortho bundle 7 day call completed. 

## 2019-09-20 DIAGNOSIS — D649 Anemia, unspecified: Secondary | ICD-10-CM | POA: Diagnosis not present

## 2019-09-20 DIAGNOSIS — Z96641 Presence of right artificial hip joint: Secondary | ICD-10-CM | POA: Diagnosis not present

## 2019-09-20 DIAGNOSIS — Z7982 Long term (current) use of aspirin: Secondary | ICD-10-CM | POA: Diagnosis not present

## 2019-09-20 DIAGNOSIS — J302 Other seasonal allergic rhinitis: Secondary | ICD-10-CM | POA: Diagnosis not present

## 2019-09-20 DIAGNOSIS — I058 Other rheumatic mitral valve diseases: Secondary | ICD-10-CM | POA: Diagnosis not present

## 2019-09-20 DIAGNOSIS — Z471 Aftercare following joint replacement surgery: Secondary | ICD-10-CM | POA: Diagnosis not present

## 2019-09-22 DIAGNOSIS — Z96641 Presence of right artificial hip joint: Secondary | ICD-10-CM | POA: Diagnosis not present

## 2019-09-22 DIAGNOSIS — Z471 Aftercare following joint replacement surgery: Secondary | ICD-10-CM | POA: Diagnosis not present

## 2019-09-22 DIAGNOSIS — J302 Other seasonal allergic rhinitis: Secondary | ICD-10-CM | POA: Diagnosis not present

## 2019-09-22 DIAGNOSIS — Z7982 Long term (current) use of aspirin: Secondary | ICD-10-CM | POA: Diagnosis not present

## 2019-09-22 DIAGNOSIS — D649 Anemia, unspecified: Secondary | ICD-10-CM | POA: Diagnosis not present

## 2019-09-22 DIAGNOSIS — I058 Other rheumatic mitral valve diseases: Secondary | ICD-10-CM | POA: Diagnosis not present

## 2019-09-23 ENCOUNTER — Telehealth: Payer: Self-pay | Admitting: *Deleted

## 2019-09-23 ENCOUNTER — Ambulatory Visit (INDEPENDENT_AMBULATORY_CARE_PROVIDER_SITE_OTHER): Payer: Medicare Other | Admitting: Orthopaedic Surgery

## 2019-09-23 ENCOUNTER — Encounter: Payer: Self-pay | Admitting: Orthopaedic Surgery

## 2019-09-23 ENCOUNTER — Other Ambulatory Visit: Payer: Self-pay

## 2019-09-23 DIAGNOSIS — Z96641 Presence of right artificial hip joint: Secondary | ICD-10-CM

## 2019-09-23 NOTE — Progress Notes (Signed)
The patient is 2 weeks tomorrow status post a right total hip arthroplasty.  She is very active 74 years old.  She is doing well overall.  She does feel that there is a leg length difference with her right operative leg being longer than the left.  From a pain standpoint she is doing quite well.  On exam her right hip incision looks good.  The staples have been removed and Steri-Strips placed.  There is difference and length when I have her lay in a supine position of the right long and her left.  I would like to see her back in 4 weeks to see how she is doing overall.  After that visit we will likely need to send her to [facility] for custom insert for her left shoe to help decrease her leg length difference.  All question concerns were answered addressed.  She will go back to just 1 aspirin daily.

## 2019-09-23 NOTE — Telephone Encounter (Signed)
14 day Ortho bundle call completed.  

## 2019-10-12 ENCOUNTER — Telehealth: Payer: Self-pay | Admitting: *Deleted

## 2019-10-12 NOTE — Telephone Encounter (Signed)
Ortho bundle 30 day call completed. °

## 2019-10-18 ENCOUNTER — Other Ambulatory Visit: Payer: Self-pay

## 2019-10-18 ENCOUNTER — Encounter: Payer: Self-pay | Admitting: Orthopaedic Surgery

## 2019-10-18 ENCOUNTER — Ambulatory Visit (INDEPENDENT_AMBULATORY_CARE_PROVIDER_SITE_OTHER): Payer: Medicare Other | Admitting: Orthopaedic Surgery

## 2019-10-18 VITALS — Ht 61.0 in | Wt 121.0 lb

## 2019-10-18 DIAGNOSIS — Z96641 Presence of right artificial hip joint: Secondary | ICD-10-CM

## 2019-10-18 NOTE — Progress Notes (Signed)
HPI: [name] returns now 5 days status post right total hip arthroplasty.  She is doing well has no complaints.  She denies any pain.  She is finished up with physical therapy.  She is currently taking no pain medications.  She denies any fevers chills shortness of breath chest pain.  Physical exam: Right hip excellent range of motion without pain.  Right calf supple nontender.  Dorsiflexion plantarflexion right ankle intact.  She ambulates without any assistive device with a slight antalgic gait.  Impression: Status post right total hip arthroplasty 09/10/2019  Plan: Follow-up in 5 months we will obtain an AP pelvis and lateral view of the right hip otherwise follow-up as needed.  Questions encouraged and answered at length.

## 2019-11-09 DIAGNOSIS — N390 Urinary tract infection, site not specified: Secondary | ICD-10-CM | POA: Diagnosis not present

## 2019-11-29 DIAGNOSIS — M19049 Primary osteoarthritis, unspecified hand: Secondary | ICD-10-CM | POA: Diagnosis not present

## 2019-11-29 DIAGNOSIS — Z0000 Encounter for general adult medical examination without abnormal findings: Secondary | ICD-10-CM | POA: Diagnosis not present

## 2019-11-29 DIAGNOSIS — G43909 Migraine, unspecified, not intractable, without status migrainosus: Secondary | ICD-10-CM | POA: Diagnosis not present

## 2019-11-29 DIAGNOSIS — Z23 Encounter for immunization: Secondary | ICD-10-CM | POA: Diagnosis not present

## 2019-11-29 DIAGNOSIS — F430 Acute stress reaction: Secondary | ICD-10-CM | POA: Diagnosis not present

## 2019-11-29 DIAGNOSIS — M8580 Other specified disorders of bone density and structure, unspecified site: Secondary | ICD-10-CM | POA: Diagnosis not present

## 2019-11-29 DIAGNOSIS — E7800 Pure hypercholesterolemia, unspecified: Secondary | ICD-10-CM | POA: Diagnosis not present

## 2019-11-29 DIAGNOSIS — N183 Chronic kidney disease, stage 3 unspecified: Secondary | ICD-10-CM | POA: Diagnosis not present

## 2019-11-29 DIAGNOSIS — Z79899 Other long term (current) drug therapy: Secondary | ICD-10-CM | POA: Diagnosis not present

## 2019-11-29 DIAGNOSIS — K589 Irritable bowel syndrome without diarrhea: Secondary | ICD-10-CM | POA: Diagnosis not present

## 2019-11-30 DIAGNOSIS — M19049 Primary osteoarthritis, unspecified hand: Secondary | ICD-10-CM | POA: Diagnosis not present

## 2019-11-30 DIAGNOSIS — K589 Irritable bowel syndrome without diarrhea: Secondary | ICD-10-CM | POA: Diagnosis not present

## 2019-11-30 DIAGNOSIS — G43909 Migraine, unspecified, not intractable, without status migrainosus: Secondary | ICD-10-CM | POA: Diagnosis not present

## 2019-11-30 DIAGNOSIS — Z0000 Encounter for general adult medical examination without abnormal findings: Secondary | ICD-10-CM | POA: Diagnosis not present

## 2019-11-30 DIAGNOSIS — E7800 Pure hypercholesterolemia, unspecified: Secondary | ICD-10-CM | POA: Diagnosis not present

## 2019-11-30 DIAGNOSIS — Z79899 Other long term (current) drug therapy: Secondary | ICD-10-CM | POA: Diagnosis not present

## 2019-11-30 DIAGNOSIS — N183 Chronic kidney disease, stage 3 unspecified: Secondary | ICD-10-CM | POA: Diagnosis not present

## 2019-11-30 DIAGNOSIS — F430 Acute stress reaction: Secondary | ICD-10-CM | POA: Diagnosis not present

## 2019-11-30 DIAGNOSIS — Z23 Encounter for immunization: Secondary | ICD-10-CM | POA: Diagnosis not present

## 2019-11-30 DIAGNOSIS — M8580 Other specified disorders of bone density and structure, unspecified site: Secondary | ICD-10-CM | POA: Diagnosis not present

## 2019-12-02 DIAGNOSIS — M8589 Other specified disorders of bone density and structure, multiple sites: Secondary | ICD-10-CM | POA: Diagnosis not present

## 2019-12-10 ENCOUNTER — Telehealth: Payer: Self-pay | Admitting: *Deleted

## 2019-12-10 NOTE — Telephone Encounter (Signed)
Attempted 90 day call to patient. No answer and left VM on home number. Will re-attempt on Monday if do not hear back today.

## 2019-12-15 ENCOUNTER — Other Ambulatory Visit (HOSPITAL_COMMUNITY): Payer: Medicare Other

## 2019-12-17 ENCOUNTER — Telehealth: Payer: Self-pay | Admitting: *Deleted

## 2019-12-17 NOTE — Telephone Encounter (Signed)
Ortho bundle 90 day call completed. 

## 2019-12-20 ENCOUNTER — Ambulatory Visit (HOSPITAL_COMMUNITY): Payer: Medicare Other | Attending: Cardiovascular Disease

## 2019-12-20 ENCOUNTER — Other Ambulatory Visit: Payer: Self-pay

## 2019-12-20 DIAGNOSIS — I340 Nonrheumatic mitral (valve) insufficiency: Secondary | ICD-10-CM | POA: Diagnosis not present

## 2019-12-20 DIAGNOSIS — I341 Nonrheumatic mitral (valve) prolapse: Secondary | ICD-10-CM | POA: Insufficient documentation

## 2019-12-20 LAB — ECHOCARDIOGRAM COMPLETE
Area-P 1/2: 2.73 cm2
S' Lateral: 2 cm

## 2019-12-23 DIAGNOSIS — H2513 Age-related nuclear cataract, bilateral: Secondary | ICD-10-CM | POA: Diagnosis not present

## 2019-12-23 DIAGNOSIS — H5211 Myopia, right eye: Secondary | ICD-10-CM | POA: Diagnosis not present

## 2019-12-23 DIAGNOSIS — H52203 Unspecified astigmatism, bilateral: Secondary | ICD-10-CM | POA: Diagnosis not present

## 2019-12-23 DIAGNOSIS — H524 Presbyopia: Secondary | ICD-10-CM | POA: Diagnosis not present

## 2019-12-24 DIAGNOSIS — Z23 Encounter for immunization: Secondary | ICD-10-CM | POA: Diagnosis not present

## 2019-12-27 DIAGNOSIS — H2513 Age-related nuclear cataract, bilateral: Secondary | ICD-10-CM | POA: Diagnosis not present

## 2019-12-27 DIAGNOSIS — H25041 Posterior subcapsular polar age-related cataract, right eye: Secondary | ICD-10-CM | POA: Diagnosis not present

## 2020-01-10 DIAGNOSIS — M169 Osteoarthritis of hip, unspecified: Secondary | ICD-10-CM | POA: Diagnosis not present

## 2020-01-10 DIAGNOSIS — E7800 Pure hypercholesterolemia, unspecified: Secondary | ICD-10-CM | POA: Diagnosis not present

## 2020-01-10 DIAGNOSIS — N183 Chronic kidney disease, stage 3 unspecified: Secondary | ICD-10-CM | POA: Diagnosis not present

## 2020-01-10 DIAGNOSIS — M19049 Primary osteoarthritis, unspecified hand: Secondary | ICD-10-CM | POA: Diagnosis not present

## 2020-01-10 DIAGNOSIS — M8580 Other specified disorders of bone density and structure, unspecified site: Secondary | ICD-10-CM | POA: Diagnosis not present

## 2020-01-11 DIAGNOSIS — H25811 Combined forms of age-related cataract, right eye: Secondary | ICD-10-CM | POA: Diagnosis not present

## 2020-01-11 DIAGNOSIS — H2511 Age-related nuclear cataract, right eye: Secondary | ICD-10-CM | POA: Diagnosis not present

## 2020-01-11 DIAGNOSIS — H25041 Posterior subcapsular polar age-related cataract, right eye: Secondary | ICD-10-CM | POA: Diagnosis not present

## 2020-01-12 NOTE — Progress Notes (Signed)
°  Patient ID: Lisa Cabrera, female   DOB: 07/09/1945, 74 y.o.   MRN: [id number]     74 y.o.  seen by Dr Sallyanne Kuster  of [facility] September 2014 . 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  All other systems reviewed and negative  General: Ht 5\' 1"  (1.549 m)    BMI 22.86 kg/m  Affect appropriate Healthy:  appears stated age 20: normal Neck supple with no adenopathy JVP normal no bruits no thyromegaly Lungs clear with no wheezing and good diaphragmatic motion Heart:  K0/X3 mid systolic ejection murmur of MVP/MR no MS murmur, no rub, gallop or click PMI normal Abdomen: benighn, BS positve, no tenderness, no AAA no bruit.  No HSM or HJR Distal pulses intact with no bruits No edema Neuro non-focal Skin warm and dry No muscular weakness    Current Outpatient Medications  Medication Sig Dispense Refill   aspirin 81 MG chewable tablet Chew 1 tablet (81 mg total) by mouth 2 (two) times daily. 30 tablet 0   Biotin 10000 MCG TABS Take 10,000 mcg by mouth daily.     calcium gluconate 500 MG tablet Take 2 tablets by mouth daily.      Cholecalciferol (VITAMIN D) 2000 UNITS CAPS Take 2,000 Units by mouth daily.      doxepin (SINEQUAN) 25 MG capsule Take 25 mg by mouth daily.  4   fluticasone (FLONASE) 50 MCG/ACT nasal spray Place 1 spray into both nostrils at bedtime.     metroNIDAZOLE (METROGEL) 1 % gel Apply 1 application topically daily. ROSACEA     Multiple  Vitamins-Minerals (MULTIVITAMIN PO) Take 1 tablet by mouth daily.      Naphazoline-Pheniramine (OPCON-A) 0.027-0.315 % SOLN Place 1 drop into both eyes in the morning and at bedtime.     Omega-3 Fatty Acids (FISH OIL PO) Take 1 capsule by mouth daily.      pravastatin (PRAVACHOL) 20 MG tablet Take 20 mg by mouth daily.     tiZANidine (ZANAFLEX) 4 MG tablet Take 1 tablet (4 mg total) by mouth every 8 (eight) hours as needed. 30 tablet 0   verapamil (CALAN-SR) 240 MG CR tablet Take 240 mg by mouth daily.      No current facility-administered medications for this visit.    Allergies  Patient has no known allergies.  Electrocardiogram:  02/09/18  SR rate 80 normal ECG 02/10/20 SR rate 73 normal   Assessment and Plan HTN:  Well controlled.  Continue current medications and low sodium Dash type diet.  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

## 2020-01-21 ENCOUNTER — Ambulatory Visit (INDEPENDENT_AMBULATORY_CARE_PROVIDER_SITE_OTHER): Payer: Medicare Other | Admitting: Cardiovascular Disease

## 2020-01-21 ENCOUNTER — Other Ambulatory Visit: Payer: Self-pay

## 2020-01-21 VITALS — BP 146/62 | HR 76 | Ht 61.0 in | Wt 121.0 lb

## 2020-01-21 DIAGNOSIS — I341 Nonrheumatic mitral (valve) prolapse: Secondary | ICD-10-CM

## 2020-01-21 NOTE — Patient Instructions (Signed)
Medication Instructions:  Your physician recommends that you continue on your current medications as directed. Please refer to the Current Medication list given to you today.  *If you need a refill on your cardiac medications before your next appointment, please call your pharmacy*   Lab Work: If you have labs (blood work) drawn today and your tests are completely normal, you will receive your results only by: Marland Kitchen MyChart Message (if you have MyChart) OR . A paper copy in the mail If you have any lab test that is abnormal or we need to change your treatment, we will call you to review the results.   Follow-Up: At [facility], you and your health needs are our priority.  As part of our continuing mission to provide you with exceptional heart care, we have created designated Provider Care Teams.  These Care Teams include your primary Cardiologist (physician) and Advanced Practice Providers (APPs -  Physician Assistants and Nurse Practitioners) who all work together to provide you with the care you need, when you need it.  We recommend signing up for the patient portal called "MyChart".  Sign up information is provided on this After Visit Summary.  MyChart is used to connect with patients for Virtual Visits (Telemedicine).  Patients are able to view lab/test results, encounter notes, upcoming appointments, etc.  Non-urgent messages can be sent to your provider as well.   To learn more about what you can do with MyChart, go to [email or web address].    Your next appointment:   1 year(s)  The format for your next appointment:   In Person  Provider:   You may see [name], MD or one of the following Advanced Practice Providers on your designated Care Team:    Truitt Merle, NP  Cecilie Kicks, NP  Kathyrn Drown, NP

## 2020-03-06 ENCOUNTER — Ambulatory Visit (INDEPENDENT_AMBULATORY_CARE_PROVIDER_SITE_OTHER): Payer: Medicare Other

## 2020-03-06 ENCOUNTER — Ambulatory Visit (INDEPENDENT_AMBULATORY_CARE_PROVIDER_SITE_OTHER): Payer: Medicare Other | Admitting: Orthopaedic Surgery

## 2020-03-06 ENCOUNTER — Encounter: Payer: Self-pay | Admitting: Orthopaedic Surgery

## 2020-03-06 DIAGNOSIS — M1612 Unilateral primary osteoarthritis, left hip: Secondary | ICD-10-CM | POA: Diagnosis not present

## 2020-03-06 DIAGNOSIS — Z96641 Presence of right artificial hip joint: Secondary | ICD-10-CM

## 2020-03-06 NOTE — Progress Notes (Signed)
The patient is status post a right total hip arthroplasty that we did in June of this year.  Is been 6 months now.  She has no issues with the right hip at all.  She has developed just a slight bit of left groin pain with known osteoarthritis of the left hip.  She also has just a slight leg length discrepancy with the right operative side slightly longer than left.  She denies any pain at all.  Both hips move smoothly and fluidly with no pain today at all.  When I have her lay in a supine position she is just slightly longer on the right than the left.  This is more noticeable to her given that she is petite.  X-rays of the pelvis and both hips show a well-seated implant on the right side.  The left side does have moderate arthritis.  If she ends up needing a hip replacement on the left side, we would be able to accommodate her leg lengths and get her back out the length.  For now, I do feel that she would benefit from just a custom insert in her left shoe and she would like to try this as well.  I gave her prescription for [employer] to try this.  All questions and concerns were answered and addressed.  For now follow-up can be as needed.

## 2020-04-28 DIAGNOSIS — M19049 Primary osteoarthritis, unspecified hand: Secondary | ICD-10-CM | POA: Diagnosis not present

## 2020-04-28 DIAGNOSIS — E7800 Pure hypercholesterolemia, unspecified: Secondary | ICD-10-CM | POA: Diagnosis not present

## 2020-04-28 DIAGNOSIS — M169 Osteoarthritis of hip, unspecified: Secondary | ICD-10-CM | POA: Diagnosis not present

## 2020-04-28 DIAGNOSIS — M8580 Other specified disorders of bone density and structure, unspecified site: Secondary | ICD-10-CM | POA: Diagnosis not present

## 2020-04-28 DIAGNOSIS — N183 Chronic kidney disease, stage 3 unspecified: Secondary | ICD-10-CM | POA: Diagnosis not present

## 2020-06-14 DIAGNOSIS — M19049 Primary osteoarthritis, unspecified hand: Secondary | ICD-10-CM | POA: Diagnosis not present

## 2020-06-14 DIAGNOSIS — E7800 Pure hypercholesterolemia, unspecified: Secondary | ICD-10-CM | POA: Diagnosis not present

## 2020-06-14 DIAGNOSIS — M8580 Other specified disorders of bone density and structure, unspecified site: Secondary | ICD-10-CM | POA: Diagnosis not present

## 2020-06-14 DIAGNOSIS — N183 Chronic kidney disease, stage 3 unspecified: Secondary | ICD-10-CM | POA: Diagnosis not present

## 2020-06-14 DIAGNOSIS — M169 Osteoarthritis of hip, unspecified: Secondary | ICD-10-CM | POA: Diagnosis not present

## 2020-07-12 DIAGNOSIS — Z23 Encounter for immunization: Secondary | ICD-10-CM | POA: Diagnosis not present

## 2020-09-16 DIAGNOSIS — M5442 Lumbago with sciatica, left side: Secondary | ICD-10-CM | POA: Diagnosis not present

## 2020-09-18 ENCOUNTER — Telehealth: Payer: Self-pay | Admitting: *Deleted

## 2020-09-18 NOTE — Telephone Encounter (Signed)
Attempted 1 year Ortho bundle call to patient; no answer and left VM requesting call back. 

## 2020-09-18 NOTE — Telephone Encounter (Signed)
Ortho bundle 1 year call completed. ?

## 2020-09-25 ENCOUNTER — Other Ambulatory Visit: Payer: Self-pay | Admitting: Home Modifications

## 2020-09-25 ENCOUNTER — Ambulatory Visit
Admission: RE | Admit: 2020-09-25 | Discharge: 2020-09-25 | Disposition: A | Discharge status: Home / Self Care | Payer: Medicare Other | Source: Ambulatory Visit | Attending: Home Modifications | Admitting: Home Modifications

## 2020-09-25 DIAGNOSIS — M545 Low back pain, unspecified: Secondary | ICD-10-CM

## 2020-09-25 DIAGNOSIS — M1612 Unilateral primary osteoarthritis, left hip: Secondary | ICD-10-CM | POA: Diagnosis not present

## 2020-09-25 DIAGNOSIS — M25552 Pain in left hip: Secondary | ICD-10-CM | POA: Diagnosis not present

## 2020-09-25 DIAGNOSIS — Z6823 Body mass index (BMI) 23.0-23.9, adult: Secondary | ICD-10-CM | POA: Diagnosis not present

## 2020-09-25 DIAGNOSIS — M5432 Sciatica, left side: Secondary | ICD-10-CM | POA: Diagnosis not present

## 2020-09-29 DIAGNOSIS — Z20822 Contact with and (suspected) exposure to covid-19: Secondary | ICD-10-CM | POA: Diagnosis not present

## 2020-10-04 ENCOUNTER — Ambulatory Visit (INDEPENDENT_AMBULATORY_CARE_PROVIDER_SITE_OTHER): Payer: Medicare Other | Admitting: Orthopaedic Surgery

## 2020-10-04 ENCOUNTER — Ambulatory Visit (INDEPENDENT_AMBULATORY_CARE_PROVIDER_SITE_OTHER): Payer: Medicare Other

## 2020-10-04 DIAGNOSIS — M5442 Lumbago with sciatica, left side: Secondary | ICD-10-CM

## 2020-10-04 MED ORDER — TRAMADOL HCL 50 MG PO TABS: 50.0000 mg | ORAL_TABLET | Freq: Four times a day (QID) | ORAL | 0 refills | Status: DC | PRN

## 2020-10-04 NOTE — Progress Notes (Signed)
Office Visit Note   Patient: Lisa Cabrera           Date of Birth: 1945-09-29           MRN: [id number] Visit Date: 10/04/2020              Requested by: Lawerance Cruel, [place],  [place] [address] PCP: Lawerance Cruel, MD   Assessment & Plan: Visit Diagnoses:  1. Acute left-sided low back pain with left-sided sciatica     Plan: Based on her clinical exam findings of sciatica with left-sided radicular symptoms including weakness combined with a failed conservative treatment including steroids, I MRI with contrast is warranted of the lumbar spine to rule out nerve compression.  I will send in some tramadol for pain and we will see her back once the MRIs been obtained.  All question concerns were answered and addressed.  Follow-Up Instructions: No follow-ups on file.   Orders:  Orders Placed This Encounter  Procedures   XR Lumbar Spine 2-3 Views   No orders of the defined types were placed in this encounter.     Procedures: No procedures performed   Clinical Data: No additional findings.   Subjective: Chief Complaint  Patient presents with   Lower Back - Pain  The patient is coming in today with acute left-sided low back pain that radiates in the sciatic region left side and all the way down to her left foot.  I have actually replaced her right hip before.  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  Occasionally she will wear a lift but sometimes she does not.  She still denies any groin pain on the left side at all.  Her pain seems to be in the lower lumbar spine to the left side and it does radiate down to her foot with some numbness and tingling.  She has a remote history of the as a young person having a significant spinal fusion which is likely for scoliosis I believe.  This was multiple level fusion of the thoracic spine.  She does have a history of herniated disc surgery she reports by Dr.  Arnoldo Morale here in town with neurosurgery.  She denies any change in bowel or bladder function.  She is already had a steroid taper and was doing better after the steroid until yesterday when she is really hurting quite a bit again.  HPI  Review of Systems There is currently no listed headache, chest pain, shortness of breath, fever, chills, nausea, vomiting  Objective: Vital Signs: There were no vitals taken for this visit.  Physical Exam She is alert and orient x3 and in no acute distress Ortho Exam On examination she does have significant left-sided low back pain.  She has a positive straight leg raise on the left side.  Her left hip moves smoothly and fluidly with no pain in the groin at all and there is a leg length discrepancy.  She does have quad weakness on the left side and hip weakness.  She also has numbness on the medial aspect of her left leg that goes into her foot. Specialty Comments:  No specialty comments available.  Imaging: No results found.   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   Past Medical History:  Diagnosis Date   Anemia    normocytic   Complication of anesthesia    HAD RAPID HEART BEAT AFTER ANESTHIA   Family history of anesthesia complication    Father had difficulty waking up   H/O seasonal allergies    Heart murmur     Family History  Problem Relation Age of Onset   Dementia Mother    Heart attack Mother    Stroke Father    Kidney disease Father    Cancer Sister        ovarian    Past Surgical History:  Procedure Laterality Date   ABDOMINAL HYSTERECTOMY     1997   LUMBAR [place]     herniated disk   SPINE SURGERY     TEE WITHOUT CARDIOVERSION N/A 12/07/2012    Procedure: TRANSESOPHAGEAL ECHOCARDIOGRAM (TEE);  Surgeon: Sanda Klein, MD;  Location[facility];  Service: Cardiovascular;  Laterality: N/A;   TOTAL HIP ARTHROPLASTY Right 09/10/2019   Procedure: RIGHT TOTAL HIP ARTHROPLASTY ANTERIOR APPROACH;  Surgeon: Mcarthur Rossetti, MD;  Location: [facility] ORS;  Service: Orthopedics;  Laterality: Right;   Social History   Occupational History   Occupation: retired  Tobacco Use   Smoking status: Never   Smokeless tobacco: Never  Vaping Use   Vaping Use: Never used  Substance and Sexual Activity   Alcohol use: Yes    Comment: occasionally   Drug use: No   Sexual activity: Yes

## 2020-10-05 ENCOUNTER — Other Ambulatory Visit: Payer: Self-pay

## 2020-10-05 DIAGNOSIS — M5442 Lumbago with sciatica, left side: Secondary | ICD-10-CM

## 2020-10-17 ENCOUNTER — Other Ambulatory Visit: Payer: Self-pay | Admitting: Orthopaedic Surgery

## 2020-10-17 DIAGNOSIS — M8580 Other specified disorders of bone density and structure, unspecified site: Secondary | ICD-10-CM | POA: Diagnosis not present

## 2020-10-17 DIAGNOSIS — M169 Osteoarthritis of hip, unspecified: Secondary | ICD-10-CM | POA: Diagnosis not present

## 2020-10-17 DIAGNOSIS — E7800 Pure hypercholesterolemia, unspecified: Secondary | ICD-10-CM | POA: Diagnosis not present

## 2020-10-17 DIAGNOSIS — N183 Chronic kidney disease, stage 3 unspecified: Secondary | ICD-10-CM | POA: Diagnosis not present

## 2020-10-17 DIAGNOSIS — M19049 Primary osteoarthritis, unspecified hand: Secondary | ICD-10-CM | POA: Diagnosis not present

## 2020-10-17 NOTE — Telephone Encounter (Signed)
ok 

## 2020-10-18 ENCOUNTER — Other Ambulatory Visit: Payer: Self-pay

## 2020-10-18 ENCOUNTER — Ambulatory Visit
Admission: RE | Admit: 2020-10-18 | Discharge: 2020-10-18 | Disposition: A | Discharge status: Home / Self Care | Payer: Medicare Other | Source: Ambulatory Visit | Attending: Orthopaedic Surgery | Admitting: Orthopaedic Surgery

## 2020-10-18 DIAGNOSIS — S32010A Wedge compression fracture of first lumbar vertebra, initial encounter for closed fracture: Secondary | ICD-10-CM | POA: Diagnosis not present

## 2020-10-18 DIAGNOSIS — M47816 Spondylosis without myelopathy or radiculopathy, lumbar region: Secondary | ICD-10-CM | POA: Diagnosis not present

## 2020-10-18 DIAGNOSIS — M5442 Lumbago with sciatica, left side: Secondary | ICD-10-CM

## 2020-10-18 DIAGNOSIS — M48061 Spinal stenosis, lumbar region without neurogenic claudication: Secondary | ICD-10-CM | POA: Diagnosis not present

## 2020-10-18 DIAGNOSIS — Z8669 Personal history of other diseases of the nervous system and sense organs: Secondary | ICD-10-CM | POA: Diagnosis not present

## 2020-10-18 MED ORDER — GADOBENATE DIMEGLUMINE 529 MG/ML IV SOLN
10.0000 mL | Freq: Once | INTRAVENOUS | Status: AC | PRN
  Administered 2020-10-18: 10 mL via INTRAVENOUS

## 2020-10-19 ENCOUNTER — Other Ambulatory Visit: Payer: Medicare Other

## 2020-10-19 DIAGNOSIS — U071 COVID-19: Secondary | ICD-10-CM | POA: Diagnosis not present

## 2020-10-25 ENCOUNTER — Ambulatory Visit (INDEPENDENT_AMBULATORY_CARE_PROVIDER_SITE_OTHER): Payer: Medicare Other | Admitting: Orthopaedic Surgery

## 2020-10-25 ENCOUNTER — Encounter: Payer: Self-pay | Admitting: Orthopaedic Surgery

## 2020-10-25 DIAGNOSIS — M48061 Spinal stenosis, lumbar region without neurogenic claudication: Secondary | ICD-10-CM | POA: Diagnosis not present

## 2020-10-25 DIAGNOSIS — M5442 Lumbago with sciatica, left side: Secondary | ICD-10-CM | POA: Diagnosis not present

## 2020-10-25 NOTE — Progress Notes (Signed)
The patient is a pleasant 75 year old female who comes in today to go over MRI of her lumbar spine.  When I saw her few weeks ago she had acute left-sided low back pain that was quite significant.  She has had a history of a discectomy before by Dr. Arnoldo Morale but this did not seem to be related.  She says today the symptoms have calmed down significantly and she is doing better overall.  She denies any change in bowel or bladder function or weakness in her legs.  I have replaced her right hip in the past.  Her last visit, she did look quite painful and in acute distress.  Overall again, she looks great.  She has negative straight leg raise to the left side and good strength and normal sensation.  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  However I explained to her that certainly with the degree of foraminal stenosis she can have times where her nerves get irritated.  Right now would not recommend anything since she is gotten so much better.  If she does have a flareup of pain again I would recommend a referral to Dr. Ernestina Patches for an epidural steroid injection.  We could always have Dr. Arnoldo Morale see her as well with neurosurgery but I think the neck step would likely be an injection if she becomes symptomatic again.  All question concerns were answered and addressed.

## 2020-11-13 DIAGNOSIS — R55 Syncope and collapse: Secondary | ICD-10-CM | POA: Diagnosis not present

## 2020-11-13 DIAGNOSIS — R42 Dizziness and giddiness: Secondary | ICD-10-CM | POA: Diagnosis not present

## 2020-11-13 DIAGNOSIS — I10 Essential (primary) hypertension: Secondary | ICD-10-CM | POA: Diagnosis not present

## 2020-11-13 DIAGNOSIS — E7800 Pure hypercholesterolemia, unspecified: Secondary | ICD-10-CM | POA: Diagnosis not present

## 2020-11-13 DIAGNOSIS — Z6824 Body mass index (BMI) 24.0-24.9, adult: Secondary | ICD-10-CM | POA: Diagnosis not present

## 2020-11-13 DIAGNOSIS — Z79899 Other long term (current) drug therapy: Secondary | ICD-10-CM | POA: Diagnosis not present

## 2020-11-20 DIAGNOSIS — G43909 Migraine, unspecified, not intractable, without status migrainosus: Secondary | ICD-10-CM | POA: Diagnosis not present

## 2020-11-20 DIAGNOSIS — I10 Essential (primary) hypertension: Secondary | ICD-10-CM | POA: Diagnosis not present

## 2020-11-20 DIAGNOSIS — Z6824 Body mass index (BMI) 24.0-24.9, adult: Secondary | ICD-10-CM | POA: Diagnosis not present

## 2020-11-29 DIAGNOSIS — U071 COVID-19: Secondary | ICD-10-CM | POA: Diagnosis not present

## 2020-12-05 DIAGNOSIS — I10 Essential (primary) hypertension: Secondary | ICD-10-CM | POA: Diagnosis not present

## 2020-12-05 DIAGNOSIS — E7800 Pure hypercholesterolemia, unspecified: Secondary | ICD-10-CM | POA: Diagnosis not present

## 2020-12-05 DIAGNOSIS — N183 Chronic kidney disease, stage 3 unspecified: Secondary | ICD-10-CM | POA: Diagnosis not present

## 2020-12-05 DIAGNOSIS — Z79899 Other long term (current) drug therapy: Secondary | ICD-10-CM | POA: Diagnosis not present

## 2020-12-11 DIAGNOSIS — E7800 Pure hypercholesterolemia, unspecified: Secondary | ICD-10-CM | POA: Diagnosis not present

## 2020-12-11 DIAGNOSIS — Z0000 Encounter for general adult medical examination without abnormal findings: Secondary | ICD-10-CM | POA: Diagnosis not present

## 2020-12-11 DIAGNOSIS — M8580 Other specified disorders of bone density and structure, unspecified site: Secondary | ICD-10-CM | POA: Diagnosis not present

## 2020-12-11 DIAGNOSIS — N183 Chronic kidney disease, stage 3 unspecified: Secondary | ICD-10-CM | POA: Diagnosis not present

## 2020-12-11 DIAGNOSIS — I10 Essential (primary) hypertension: Secondary | ICD-10-CM | POA: Diagnosis not present

## 2020-12-11 DIAGNOSIS — F430 Acute stress reaction: Secondary | ICD-10-CM | POA: Diagnosis not present

## 2020-12-11 DIAGNOSIS — G43909 Migraine, unspecified, not intractable, without status migrainosus: Secondary | ICD-10-CM | POA: Diagnosis not present

## 2021-01-03 DIAGNOSIS — Z23 Encounter for immunization: Secondary | ICD-10-CM | POA: Diagnosis not present

## 2021-01-29 DIAGNOSIS — E7800 Pure hypercholesterolemia, unspecified: Secondary | ICD-10-CM | POA: Diagnosis not present

## 2021-01-29 DIAGNOSIS — M169 Osteoarthritis of hip, unspecified: Secondary | ICD-10-CM | POA: Diagnosis not present

## 2021-01-29 DIAGNOSIS — I10 Essential (primary) hypertension: Secondary | ICD-10-CM | POA: Diagnosis not present

## 2021-01-29 DIAGNOSIS — N183 Chronic kidney disease, stage 3 unspecified: Secondary | ICD-10-CM | POA: Diagnosis not present

## 2021-01-29 DIAGNOSIS — M19049 Primary osteoarthritis, unspecified hand: Secondary | ICD-10-CM | POA: Diagnosis not present

## 2021-01-29 DIAGNOSIS — M8580 Other specified disorders of bone density and structure, unspecified site: Secondary | ICD-10-CM | POA: Diagnosis not present

## 2021-01-29 NOTE — Progress Notes (Signed)
Patient ID: Lisa Cabrera, female   DOB: [birth year], 75 y.o.   MRN: [id number]     75 y.o.  seen by Dr Sallyanne Kuster  of [facility] September 2014 . September 2021 ill after dental appt Had Strep bacteremia and Rx for presumed SBE with 4 weeks of iv antibiotics   Echo 12/20/19 anterior leaflet prolapse mild MR normal EF  No cardiac complaints BP runs better at home   Had right THR 09/10/19 and is doing well Dr Ninfa Linden  No cardiac issues Discussed higher risk of developing PAF as she gets older   Back pain with history of discectomy with Dr Arnoldo Morale some foraminal stenosis on MRI L3-5 on 10/18/20   Having dental crown done this week and has SBE antibiotics   ROS: Denies fever, malais, weight loss, blurry vision, decreased visual acuity, cough, sputum, SOB, hemoptysis, pleuritic pain, palpitaitons, heartburn, abdominal pain, melena, lower extremity edema, claudication, or rash.  All other systems reviewed and negative  General: BP (!) 150/72   Pulse 75   Ht 5\' 1"  (1.549 m)   Wt 124 lb (56.2 kg)   SpO2 94%   BMI 23.43 kg/m  Affect appropriate Healthy:  appears stated age 19: normal Neck supple with no adenopathy JVP normal no bruits no thyromegaly Lungs clear with no wheezing and good diaphragmatic motion Heart:  W2/H8 mid systolic ejection murmur of MVP/MR no MS murmur, no rub, gallop or click PMI normal Abdomen: benighn, BS positve, no tenderness, no AAA no bruit.  No HSM or HJR Distal pulses intact with no bruits No edema Neuro non-focal Skin warm and dry No muscular weakness    Current Outpatient Medications  Medication Sig Dispense Refill   aspirin 81 MG chewable tablet Chew 1 tablet (81 mg total) by mouth 2 (two) times daily. 30 tablet 0   Biotin 10000 MCG TABS Take 10,000 mcg by mouth daily.     calcium gluconate 500 MG tablet Take 2 tablets by mouth daily.      Cholecalciferol (VITAMIN D) 2000 UNITS CAPS Take 2,000 Units by mouth daily.      doxepin (SINEQUAN) 25 MG  capsule Take 25 mg by mouth daily.  4   fluticasone (FLONASE) 50 MCG/ACT nasal spray Place 1 spray into both nostrils at bedtime.     losartan (COZAAR) 50 MG tablet Take 50 mg by mouth daily.     metroNIDAZOLE (METROGEL) 1 % gel Apply 1 application topically daily. ROSACEA     Multiple Vitamins-Minerals (MULTIVITAMIN PO) Take 1 tablet by mouth daily.      Naphazoline-Pheniramine (OPCON-A) 0.027-0.315 % SOLN Place 1 drop into both eyes in the morning and at bedtime.     Omega-3 Fatty Acids (FISH OIL PO) Take 1 capsule by mouth daily.      pravastatin (PRAVACHOL) 20 MG tablet Take 20 mg by mouth daily.     verapamil (CALAN-SR) 240 MG CR tablet Take 240 mg by mouth daily.      traMADol (ULTRAM) 50 MG tablet TAKE 1-2 TABLETS BY MOUTH EVERY 6 HOURS AS NEEDED. (Patient not taking: Reported on 02/05/2021) 30 tablet 0   No current facility-administered medications for this visit.    Allergies  Monascus purpureus went yeast  Electrocardiogram:  02/09/18  SR rate 80 normal ECG 02/10/20 SR rate 73 normal  02/05/2021 NSR rate 75 normal    Assessment and Plan HTN:  Well controlled.  Continue current medications and low sodium Dash type diet.  BP better at home  MVP/MR:  Stable mild MR and anterior leaflet prolapse by TTE 12/20/19 Life long SBE prophylaxis with underlying abnormal valve and history of SBE will update echo   Ortho:  Post Right THR doing well f/u [facility] Back pain consider f/u with Dr Arnoldo Morale and lumbar steroid injection if back pain worsens again    Echo for MVP and MR   F/U in a year   [employer]

## 2021-02-01 DIAGNOSIS — U071 COVID-19: Secondary | ICD-10-CM | POA: Diagnosis not present

## 2021-02-05 ENCOUNTER — Other Ambulatory Visit: Payer: Self-pay

## 2021-02-05 ENCOUNTER — Encounter: Payer: Self-pay | Admitting: Cardiovascular Disease

## 2021-02-05 ENCOUNTER — Ambulatory Visit (INDEPENDENT_AMBULATORY_CARE_PROVIDER_SITE_OTHER): Payer: Medicare Other | Admitting: Cardiovascular Disease

## 2021-02-05 VITALS — BP 150/72 | HR 75 | Ht 61.0 in | Wt 124.0 lb

## 2021-02-05 DIAGNOSIS — I341 Nonrheumatic mitral (valve) prolapse: Secondary | ICD-10-CM

## 2021-02-05 DIAGNOSIS — I10 Essential (primary) hypertension: Secondary | ICD-10-CM

## 2021-02-05 NOTE — Patient Instructions (Signed)
Medication Instructions:  *If you need a refill on your cardiac medications before your next appointment, please call your pharmacy*  Lab Work: If you have labs (blood work) drawn today and your tests are completely normal, you will receive your results only by: [place] (if you have MyChart) OR A paper copy in the mail If you have any lab test that is abnormal or we need to change your treatment, we will call you to review the results.  Testing/Procedures: Your physician has requested that you have an echocardiogram. Echocardiography is a painless test that uses sound waves to create images of your heart. It provides your doctor with information about the size and shape of your heart and how well your heart's chambers and valves are working. This procedure takes approximately one hour. There are no restrictions for this procedure.  Follow-Up: At [facility], you and your health needs are our priority.  As part of our continuing mission to provide you with exceptional heart care, we have created designated Provider Care Teams.  These Care Teams include your primary Cardiologist (physician) and Advanced Practice Providers (APPs -  Physician Assistants and Nurse Practitioners) who all work together to provide you with the care you need, when you need it.  We recommend signing up for the patient portal called "MyChart".  Sign up information is provided on this After Visit Summary.  MyChart is used to connect with patients for Virtual Visits (Telemedicine).  Patients are able to view lab/test results, encounter notes, upcoming appointments, etc.  Non-urgent messages can be sent to your provider as well.   To learn more about what you can do with MyChart, go to [email or web address].    Your next appointment:   1 year  The format for your next appointment:   In Person  Provider:   [name], MD

## 2021-02-20 ENCOUNTER — Other Ambulatory Visit (HOSPITAL_COMMUNITY): Payer: Medicare Other

## 2021-02-20 DIAGNOSIS — Z20828 Contact with and (suspected) exposure to other viral communicable diseases: Secondary | ICD-10-CM | POA: Diagnosis not present

## 2021-03-04 DIAGNOSIS — Z23 Encounter for immunization: Secondary | ICD-10-CM | POA: Diagnosis not present

## 2021-03-09 ENCOUNTER — Ambulatory Visit (HOSPITAL_COMMUNITY): Payer: Medicare Other | Attending: Internal Medicine

## 2021-03-09 ENCOUNTER — Other Ambulatory Visit: Payer: Self-pay

## 2021-03-09 DIAGNOSIS — I341 Nonrheumatic mitral (valve) prolapse: Secondary | ICD-10-CM | POA: Diagnosis not present

## 2021-03-09 LAB — ECHOCARDIOGRAM COMPLETE
Area-P 1/2: 3.65 cm2
S' Lateral: 2.1 cm

## 2021-03-20 DIAGNOSIS — Z1231 Encounter for screening mammogram for malignant neoplasm of breast: Secondary | ICD-10-CM | POA: Diagnosis not present

## 2021-04-18 DIAGNOSIS — H52203 Unspecified astigmatism, bilateral: Secondary | ICD-10-CM | POA: Diagnosis not present

## 2021-04-18 DIAGNOSIS — H2512 Age-related nuclear cataract, left eye: Secondary | ICD-10-CM | POA: Diagnosis not present

## 2021-05-01 DIAGNOSIS — D225 Melanocytic nevi of trunk: Secondary | ICD-10-CM | POA: Diagnosis not present

## 2021-05-01 DIAGNOSIS — L719 Rosacea, unspecified: Secondary | ICD-10-CM | POA: Diagnosis not present

## 2021-05-01 DIAGNOSIS — L814 Other melanin hyperpigmentation: Secondary | ICD-10-CM | POA: Diagnosis not present

## 2021-05-01 DIAGNOSIS — D2239 Melanocytic nevi of other parts of face: Secondary | ICD-10-CM | POA: Diagnosis not present

## 2021-05-01 DIAGNOSIS — D2272 Melanocytic nevi of left lower limb, including hip: Secondary | ICD-10-CM | POA: Diagnosis not present

## 2021-05-01 DIAGNOSIS — L578 Other skin changes due to chronic exposure to nonionizing radiation: Secondary | ICD-10-CM | POA: Diagnosis not present

## 2021-05-01 DIAGNOSIS — L570 Actinic keratosis: Secondary | ICD-10-CM | POA: Diagnosis not present

## 2021-05-01 DIAGNOSIS — L821 Other seborrheic keratosis: Secondary | ICD-10-CM | POA: Diagnosis not present

## 2021-05-01 DIAGNOSIS — Z23 Encounter for immunization: Secondary | ICD-10-CM | POA: Diagnosis not present

## 2021-07-19 DIAGNOSIS — Z20822 Contact with and (suspected) exposure to covid-19: Secondary | ICD-10-CM | POA: Diagnosis not present

## 2021-07-27 DIAGNOSIS — L570 Actinic keratosis: Secondary | ICD-10-CM | POA: Diagnosis not present

## 2021-08-02 DIAGNOSIS — Z20822 Contact with and (suspected) exposure to covid-19: Secondary | ICD-10-CM | POA: Diagnosis not present

## 2021-08-25 DIAGNOSIS — Z23 Encounter for immunization: Secondary | ICD-10-CM | POA: Diagnosis not present

## 2021-12-17 DIAGNOSIS — E7800 Pure hypercholesterolemia, unspecified: Secondary | ICD-10-CM | POA: Diagnosis not present

## 2021-12-17 DIAGNOSIS — I10 Essential (primary) hypertension: Secondary | ICD-10-CM | POA: Diagnosis not present

## 2021-12-21 DIAGNOSIS — Z23 Encounter for immunization: Secondary | ICD-10-CM | POA: Diagnosis not present

## 2021-12-24 DIAGNOSIS — I10 Essential (primary) hypertension: Secondary | ICD-10-CM | POA: Diagnosis not present

## 2021-12-24 DIAGNOSIS — N183 Chronic kidney disease, stage 3 unspecified: Secondary | ICD-10-CM | POA: Diagnosis not present

## 2021-12-24 DIAGNOSIS — F430 Acute stress reaction: Secondary | ICD-10-CM | POA: Diagnosis not present

## 2021-12-24 DIAGNOSIS — E7800 Pure hypercholesterolemia, unspecified: Secondary | ICD-10-CM | POA: Diagnosis not present

## 2021-12-24 DIAGNOSIS — Z6824 Body mass index (BMI) 24.0-24.9, adult: Secondary | ICD-10-CM | POA: Diagnosis not present

## 2021-12-24 DIAGNOSIS — G43909 Migraine, unspecified, not intractable, without status migrainosus: Secondary | ICD-10-CM | POA: Diagnosis not present

## 2021-12-24 DIAGNOSIS — M8588 Other specified disorders of bone density and structure, other site: Secondary | ICD-10-CM | POA: Diagnosis not present

## 2021-12-24 DIAGNOSIS — Z0000 Encounter for general adult medical examination without abnormal findings: Secondary | ICD-10-CM | POA: Diagnosis not present

## 2021-12-24 DIAGNOSIS — K589 Irritable bowel syndrome without diarrhea: Secondary | ICD-10-CM | POA: Diagnosis not present

## 2022-04-19 DIAGNOSIS — Z780 Asymptomatic menopausal state: Secondary | ICD-10-CM | POA: Diagnosis not present

## 2022-04-19 DIAGNOSIS — Z1231 Encounter for screening mammogram for malignant neoplasm of breast: Secondary | ICD-10-CM | POA: Diagnosis not present

## 2022-04-19 DIAGNOSIS — M8589 Other specified disorders of bone density and structure, multiple sites: Secondary | ICD-10-CM | POA: Diagnosis not present

## 2022-04-22 DIAGNOSIS — H52203 Unspecified astigmatism, bilateral: Secondary | ICD-10-CM | POA: Diagnosis not present

## 2022-04-22 DIAGNOSIS — H2512 Age-related nuclear cataract, left eye: Secondary | ICD-10-CM | POA: Diagnosis not present

## 2022-04-22 NOTE — Progress Notes (Signed)
Patient ID: Lisa Cabrera, female   DOB: 07-18-1945, 77 y.o.   MRN: [id number]     77 y.o.  seen by Dr Sallyanne Kuster  of [facility] September 2014 . September 2021 ill after dental appt Had Strep bacteremia and Rx for presumed SBE with 4 weeks of iv antibiotics   Echo 12/20/19 anterior leaflet prolapse mild MR normal EF Echo 03/09/21 myxomatious MV mild/moderate MR normal EF 70-75%   Had right THR 09/10/19 and is doing well Dr Ninfa Linden Prior dental crown. Understands Need for SBE given above   Back pain with history of discectomy with Dr Arnoldo Morale some foraminal stenosis on MRI L3-5 on 10/18/20   No issues active likes to play cards and ukulele   ROS: Denies fever, malais, weight loss, blurry vision, decreased visual acuity, cough, sputum, SOB, hemoptysis, pleuritic pain, palpitaitons, heartburn, abdominal pain, melena, lower extremity edema, claudication, or rash.  All other systems reviewed and negative  General: BP 130/82   Pulse 68   Ht 5' 1.5" (1.562 m)   Wt 128 lb (58.1 kg)   SpO2 94%   BMI 23.79 kg/m  Affect appropriate Healthy:  appears stated age 40: normal Neck supple with no adenopathy JVP normal no bruits no thyromegaly Lungs clear with no wheezing and good diaphragmatic motion Heart:  Z1/I9 mid systolic ejection murmur of MVP/MR no MS murmur, no rub, gallop or click PMI normal Abdomen: benighn, BS positve, no tenderness, no AAA no bruit.  No HSM or HJR Distal pulses intact with no bruits No edema Neuro non-focal Skin warm and dry No muscular weakness    Current Outpatient Medications  Medication Sig Dispense Refill   aspirin 81 MG chewable tablet Chew 1 tablet (81 mg total) by mouth 2 (two) times daily. 30 tablet 0   Biotin 10000 MCG TABS Take 10,000 mcg by mouth daily.     calcium gluconate 500 MG tablet Take 2 tablets by mouth daily.      Cholecalciferol (VITAMIN D) 2000 UNITS CAPS Take 2,000 Units by mouth daily.      doxepin (SINEQUAN) 25 MG capsule Take 25  mg by mouth daily.  4   fluticasone (FLONASE) 50 MCG/ACT nasal spray Place 1 spray into both nostrils at bedtime.     losartan (COZAAR) 50 MG tablet Take 50 mg by mouth daily.     metroNIDAZOLE (METROGEL) 1 % gel Apply 1 application topically daily. ROSACEA     Multiple Vitamins-Minerals (MULTIVITAMIN PO) Take 1 tablet by mouth daily.      Naphazoline-Pheniramine (OPCON-A) 0.027-0.315 % SOLN Place 1 drop into both eyes in the morning and at bedtime.     Omega-3 Fatty Acids (FISH OIL PO) Take 1 capsule by mouth daily.      pravastatin (PRAVACHOL) 20 MG tablet Take 20 mg by mouth daily.     verapamil (CALAN-SR) 240 MG CR tablet Take 240 mg by mouth daily.      traMADol (ULTRAM) 50 MG tablet TAKE 1-2 TABLETS BY MOUTH EVERY 6 HOURS AS NEEDED. (Patient not taking: Reported on 04/30/2022) 30 tablet 0   No current facility-administered medications for this visit.    Allergies  Monascus purpureus went yeast  Electrocardiogram:  04/30/2022 NSR rate 68 normal    Assessment and Plan HTN:  Well controlled.  Continue current medications and low sodium Dash type diet.  BP better at home   MVP/MR:  Stable mild-moderate  MR and anterior leaflet prolapse by TTE 03/09/21 Life long SBE prophylaxis with underlying  abnormal valve and history of SBE   Ortho:  Post Right THR doing well f/u [facility] Back pain consider f/u with Dr Arnoldo Morale and lumbar steroid injection if back pain worsens again     F/U in a year   [name]

## 2022-04-30 ENCOUNTER — Ambulatory Visit: Payer: Medicare Other | Attending: Cardiovascular Disease | Admitting: Cardiovascular Disease

## 2022-04-30 ENCOUNTER — Encounter: Payer: Self-pay | Admitting: Cardiovascular Disease

## 2022-04-30 VITALS — BP 130/82 | HR 68 | Ht 61.5 in | Wt 128.0 lb

## 2022-04-30 DIAGNOSIS — I341 Nonrheumatic mitral (valve) prolapse: Secondary | ICD-10-CM | POA: Diagnosis not present

## 2022-04-30 DIAGNOSIS — I10 Essential (primary) hypertension: Secondary | ICD-10-CM | POA: Insufficient documentation

## 2022-04-30 NOTE — Patient Instructions (Signed)
Medication Instructions:  Your physician recommends that you continue on your current medications as directed. Please refer to the Current Medication list given to you today.  *If you need a refill on your cardiac medications before your next appointment, please call your pharmacy*  Lab Work: If you have labs (blood work) drawn today and your tests are completely normal, you will receive your results only by: MyChart Message (if you have MyChart) OR A paper copy in the mail If you have any lab test that is abnormal or we need to change your treatment, we will call you to review the results.  Testing/Procedures: None ordered today.  Follow-Up: At [facility], you and your health needs are our priority.  As part of our continuing mission to provide you with exceptional heart care, we have created designated Provider Care Teams.  These Care Teams include your primary Cardiologist (physician) and Advanced Practice Providers (APPs -  Physician Assistants and Nurse Practitioners) who all work together to provide you with the care you need, when you need it.  We recommend signing up for the patient portal called "MyChart".  Sign up information is provided on this After Visit Summary.  MyChart is used to connect with patients for Virtual Visits (Telemedicine).  Patients are able to view lab/test results, encounter notes, upcoming appointments, etc.  Non-urgent messages can be sent to your provider as well.   To learn more about what you can do with MyChart, go to [email or web address].    Your next appointment:   12 month(s)  Provider:   Peter Nishan, MD     

## 2022-08-20 DIAGNOSIS — L821 Other seborrheic keratosis: Secondary | ICD-10-CM | POA: Diagnosis not present

## 2022-08-20 DIAGNOSIS — D225 Melanocytic nevi of trunk: Secondary | ICD-10-CM | POA: Diagnosis not present

## 2022-08-20 DIAGNOSIS — D2239 Melanocytic nevi of other parts of face: Secondary | ICD-10-CM | POA: Diagnosis not present

## 2022-08-20 DIAGNOSIS — L814 Other melanin hyperpigmentation: Secondary | ICD-10-CM | POA: Diagnosis not present

## 2022-08-20 DIAGNOSIS — D2271 Melanocytic nevi of right lower limb, including hip: Secondary | ICD-10-CM | POA: Diagnosis not present

## 2022-08-20 DIAGNOSIS — L570 Actinic keratosis: Secondary | ICD-10-CM | POA: Diagnosis not present

## 2022-08-20 DIAGNOSIS — L578 Other skin changes due to chronic exposure to nonionizing radiation: Secondary | ICD-10-CM | POA: Diagnosis not present

## 2022-11-06 IMAGING — CR DG HIP (WITH OR WITHOUT PELVIS) 2-3V*L*
2 series · 2 of 2 positions shown · non-contrast
Comparison: None.

CLINICAL DATA: Left hip pain

EXAM:
DG HIP (WITH OR WITHOUT PELVIS) 2-3V LEFT

[w hip ap left]
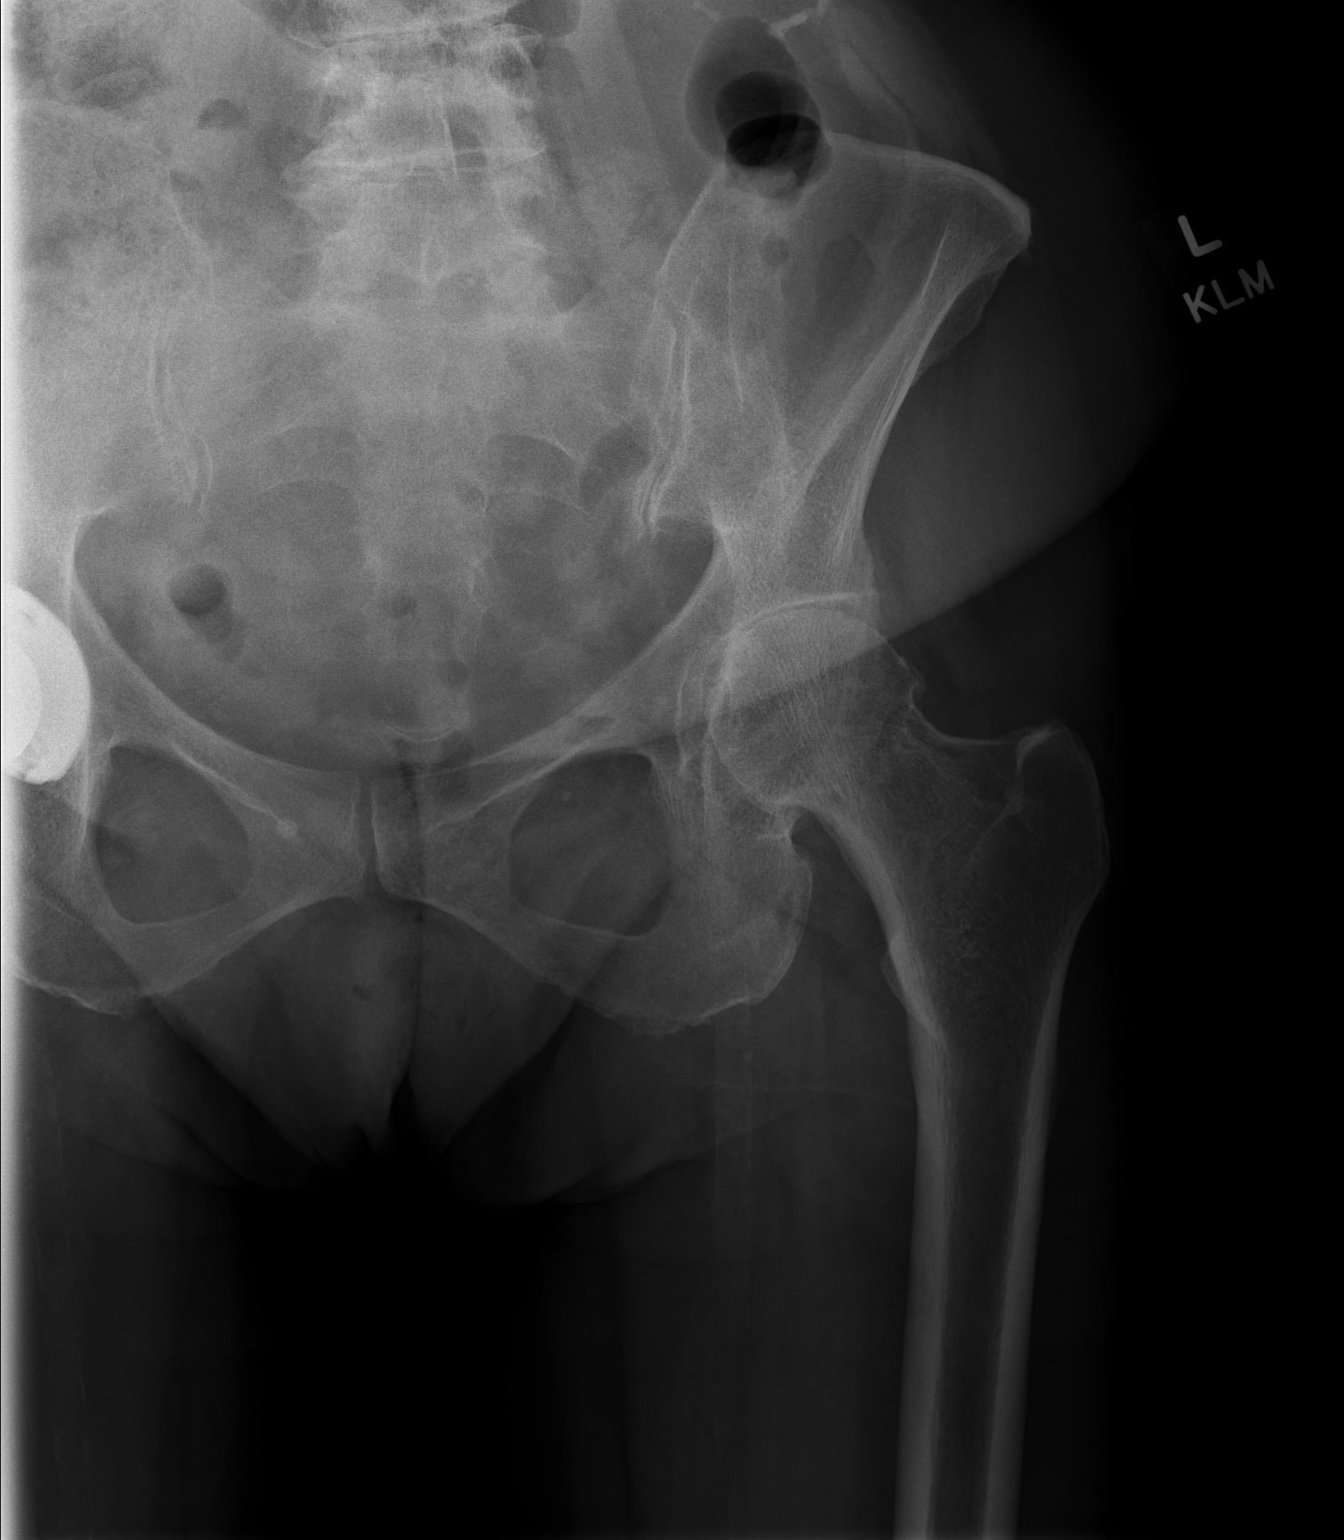

[t hip frog left]
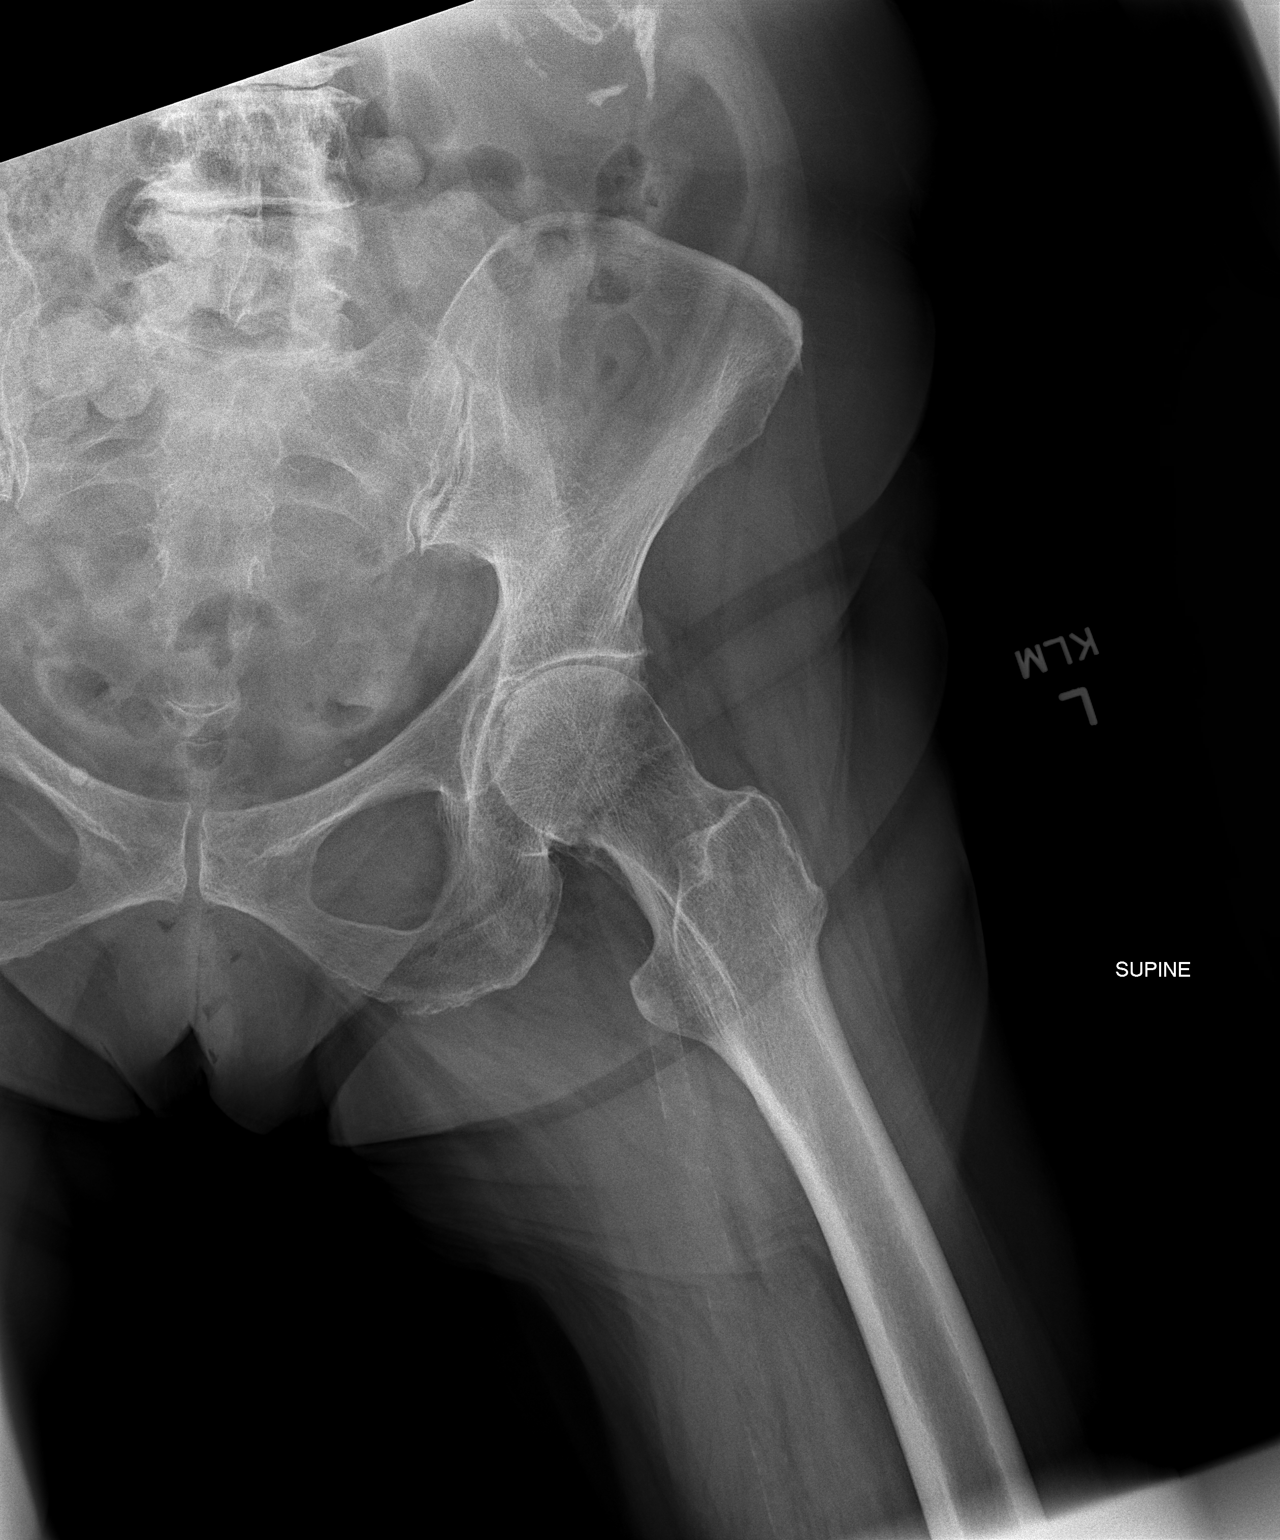

[2 of 2 positions shown; findings below may reference images not displayed]

FINDINGS: Two views study shows loss of joint space with hypertrophic spurring
on the femoral head. No evidence for an acute fracture. SI joints
and symphysis pubis unremarkable. Right hip replacement incompletely
visualized.
IMPRESSION: Degenerative changes in the left hip without acute bony abnormality.

## 2022-11-27 DIAGNOSIS — Z23 Encounter for immunization: Secondary | ICD-10-CM | POA: Diagnosis not present

## 2022-12-30 DIAGNOSIS — I10 Essential (primary) hypertension: Secondary | ICD-10-CM | POA: Diagnosis not present

## 2022-12-30 DIAGNOSIS — E7800 Pure hypercholesterolemia, unspecified: Secondary | ICD-10-CM | POA: Diagnosis not present

## 2023-01-06 DIAGNOSIS — E7800 Pure hypercholesterolemia, unspecified: Secondary | ICD-10-CM | POA: Diagnosis not present

## 2023-01-06 DIAGNOSIS — K589 Irritable bowel syndrome without diarrhea: Secondary | ICD-10-CM | POA: Diagnosis not present

## 2023-01-06 DIAGNOSIS — F430 Acute stress reaction: Secondary | ICD-10-CM | POA: Diagnosis not present

## 2023-01-06 DIAGNOSIS — M545 Low back pain, unspecified: Secondary | ICD-10-CM | POA: Diagnosis not present

## 2023-01-06 DIAGNOSIS — I10 Essential (primary) hypertension: Secondary | ICD-10-CM | POA: Diagnosis not present

## 2023-01-06 DIAGNOSIS — G43909 Migraine, unspecified, not intractable, without status migrainosus: Secondary | ICD-10-CM | POA: Diagnosis not present

## 2023-01-06 DIAGNOSIS — Z0000 Encounter for general adult medical examination without abnormal findings: Secondary | ICD-10-CM | POA: Diagnosis not present

## 2023-01-06 DIAGNOSIS — Z23 Encounter for immunization: Secondary | ICD-10-CM | POA: Diagnosis not present

## 2023-01-06 DIAGNOSIS — M8588 Other specified disorders of bone density and structure, other site: Secondary | ICD-10-CM | POA: Diagnosis not present

## 2023-01-09 ENCOUNTER — Other Ambulatory Visit: Payer: Self-pay

## 2023-01-09 ENCOUNTER — Ambulatory Visit: Payer: Medicare Other | Attending: Family Medicine | Admitting: Physical Therapy

## 2023-01-09 DIAGNOSIS — M5459 Other low back pain: Secondary | ICD-10-CM | POA: Insufficient documentation

## 2023-01-09 DIAGNOSIS — R262 Difficulty in walking, not elsewhere classified: Secondary | ICD-10-CM | POA: Insufficient documentation

## 2023-01-09 DIAGNOSIS — M6281 Muscle weakness (generalized): Secondary | ICD-10-CM | POA: Diagnosis not present
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
## 2023-01-16 ENCOUNTER — Ambulatory Visit: Payer: Medicare Other | Admitting: Physical Therapy

## 2023-01-16 ENCOUNTER — Encounter: Payer: Self-pay | Admitting: Physical Therapy

## 2023-01-16 DIAGNOSIS — R262 Difficulty in walking, not elsewhere classified: Secondary | ICD-10-CM | POA: Diagnosis not present

## 2023-01-16 DIAGNOSIS — M6281 Muscle weakness (generalized): Secondary | ICD-10-CM

## 2023-01-16 DIAGNOSIS — M5459 Other low back pain: Secondary | ICD-10-CM | POA: Diagnosis not present
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
## 2023-01-21 ENCOUNTER — Encounter: Payer: Self-pay | Admitting: Physical Therapy

## 2023-01-21 ENCOUNTER — Ambulatory Visit: Payer: Medicare Other | Admitting: Physical Therapy

## 2023-01-21 DIAGNOSIS — M5459 Other low back pain: Secondary | ICD-10-CM | POA: Diagnosis not present

## 2023-01-21 DIAGNOSIS — M6281 Muscle weakness (generalized): Secondary | ICD-10-CM | POA: Diagnosis not present

## 2023-01-21 DIAGNOSIS — R262 Difficulty in walking, not elsewhere classified: Secondary | ICD-10-CM

## 2023-01-23 ENCOUNTER — Ambulatory Visit: Payer: Medicare Other | Admitting: Rehabilitative and Restorative Service Providers"

## 2023-01-23 ENCOUNTER — Encounter: Payer: Self-pay | Admitting: Rehabilitative and Restorative Service Providers"

## 2023-01-23 DIAGNOSIS — M6281 Muscle weakness (generalized): Secondary | ICD-10-CM | POA: Diagnosis not present

## 2023-01-23 DIAGNOSIS — R262 Difficulty in walking, not elsewhere classified: Secondary | ICD-10-CM | POA: Diagnosis not present

## 2023-01-23 DIAGNOSIS — M5459 Other low back pain: Secondary | ICD-10-CM
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
## 2023-01-28 ENCOUNTER — Ambulatory Visit: Payer: Medicare Other | Admitting: Rehabilitative and Restorative Service Providers"

## 2023-01-28 ENCOUNTER — Encounter: Payer: Self-pay | Admitting: Rehabilitative and Restorative Service Providers"

## 2023-01-28 DIAGNOSIS — R262 Difficulty in walking, not elsewhere classified: Secondary | ICD-10-CM | POA: Diagnosis not present

## 2023-01-28 DIAGNOSIS — M5459 Other low back pain: Secondary | ICD-10-CM

## 2023-01-28 DIAGNOSIS — M6281 Muscle weakness (generalized): Secondary | ICD-10-CM | POA: Diagnosis not present
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
## 2023-01-30 ENCOUNTER — Encounter: Payer: Self-pay | Admitting: Rehabilitative and Restorative Service Providers"

## 2023-01-30 ENCOUNTER — Ambulatory Visit: Payer: Medicare Other | Admitting: Rehabilitative and Restorative Service Providers"

## 2023-01-30 DIAGNOSIS — M6281 Muscle weakness (generalized): Secondary | ICD-10-CM | POA: Diagnosis not present

## 2023-01-30 DIAGNOSIS — R262 Difficulty in walking, not elsewhere classified: Secondary | ICD-10-CM | POA: Diagnosis not present

## 2023-01-30 DIAGNOSIS — M5459 Other low back pain: Secondary | ICD-10-CM | POA: Diagnosis not present
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
## 2023-02-04 ENCOUNTER — Ambulatory Visit: Payer: Medicare Other | Attending: Family Medicine | Admitting: Rehabilitative and Restorative Service Providers"

## 2023-02-04 ENCOUNTER — Encounter: Payer: Self-pay | Admitting: Rehabilitative and Restorative Service Providers"

## 2023-02-04 DIAGNOSIS — M6281 Muscle weakness (generalized): Secondary | ICD-10-CM | POA: Diagnosis not present

## 2023-02-04 DIAGNOSIS — R262 Difficulty in walking, not elsewhere classified: Secondary | ICD-10-CM | POA: Diagnosis not present

## 2023-02-04 DIAGNOSIS — M5459 Other low back pain: Secondary | ICD-10-CM | POA: Insufficient documentation
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
## 2023-02-06 ENCOUNTER — Ambulatory Visit: Payer: Medicare Other | Admitting: Rehabilitative and Restorative Service Providers"

## 2023-02-06 ENCOUNTER — Encounter: Payer: Self-pay | Admitting: Rehabilitative and Restorative Service Providers"

## 2023-02-06 DIAGNOSIS — R262 Difficulty in walking, not elsewhere classified: Secondary | ICD-10-CM

## 2023-02-06 DIAGNOSIS — M6281 Muscle weakness (generalized): Secondary | ICD-10-CM | POA: Diagnosis not present

## 2023-02-06 DIAGNOSIS — M5459 Other low back pain: Secondary | ICD-10-CM
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
## 2023-02-11 ENCOUNTER — Ambulatory Visit: Payer: Medicare Other | Admitting: Physical Therapy

## 2023-02-11 DIAGNOSIS — M5459 Other low back pain: Secondary | ICD-10-CM | POA: Diagnosis not present

## 2023-02-11 DIAGNOSIS — M6281 Muscle weakness (generalized): Secondary | ICD-10-CM | POA: Diagnosis not present

## 2023-02-11 DIAGNOSIS — R262 Difficulty in walking, not elsewhere classified: Secondary | ICD-10-CM | POA: Diagnosis not present
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
## 2023-02-13 ENCOUNTER — Ambulatory Visit: Payer: Medicare Other | Admitting: Physical Therapy

## 2023-02-13 DIAGNOSIS — M5459 Other low back pain: Secondary | ICD-10-CM

## 2023-02-13 DIAGNOSIS — R262 Difficulty in walking, not elsewhere classified: Secondary | ICD-10-CM

## 2023-02-13 DIAGNOSIS — M6281 Muscle weakness (generalized): Secondary | ICD-10-CM
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
## 2023-02-18 ENCOUNTER — Ambulatory Visit: Payer: Medicare Other | Admitting: Physical Therapy

## 2023-02-18 DIAGNOSIS — M6281 Muscle weakness (generalized): Secondary | ICD-10-CM

## 2023-02-18 DIAGNOSIS — R262 Difficulty in walking, not elsewhere classified: Secondary | ICD-10-CM | POA: Diagnosis not present

## 2023-02-18 DIAGNOSIS — M5459 Other low back pain: Secondary | ICD-10-CM
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
## 2023-02-20 ENCOUNTER — Ambulatory Visit: Payer: Medicare Other | Admitting: Physical Therapy

## 2023-02-20 DIAGNOSIS — M5459 Other low back pain: Secondary | ICD-10-CM | POA: Diagnosis not present

## 2023-02-20 DIAGNOSIS — R262 Difficulty in walking, not elsewhere classified: Secondary | ICD-10-CM

## 2023-02-20 DIAGNOSIS — M6281 Muscle weakness (generalized): Secondary | ICD-10-CM | POA: Diagnosis not present
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
## 2023-02-25 ENCOUNTER — Ambulatory Visit: Payer: Medicare Other | Admitting: Physical Therapy

## 2023-02-25 DIAGNOSIS — R262 Difficulty in walking, not elsewhere classified: Secondary | ICD-10-CM

## 2023-02-25 DIAGNOSIS — M6281 Muscle weakness (generalized): Secondary | ICD-10-CM | POA: Diagnosis not present

## 2023-02-25 DIAGNOSIS — M5459 Other low back pain: Secondary | ICD-10-CM
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
## 2023-03-04 ENCOUNTER — Ambulatory Visit: Payer: Medicare Other | Attending: Family Medicine | Admitting: Physical Therapy

## 2023-03-04 DIAGNOSIS — M5459 Other low back pain: Secondary | ICD-10-CM | POA: Diagnosis not present

## 2023-03-04 DIAGNOSIS — R262 Difficulty in walking, not elsewhere classified: Secondary | ICD-10-CM | POA: Insufficient documentation

## 2023-03-04 DIAGNOSIS — M6281 Muscle weakness (generalized): Secondary | ICD-10-CM | POA: Insufficient documentation
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
## 2023-03-06 ENCOUNTER — Encounter: Payer: Medicare Other | Admitting: Physical Therapy

## 2023-03-20 DIAGNOSIS — R42 Dizziness and giddiness: Secondary | ICD-10-CM | POA: Diagnosis not present

## 2023-03-20 DIAGNOSIS — Z6824 Body mass index (BMI) 24.0-24.9, adult: Secondary | ICD-10-CM | POA: Diagnosis not present

## 2023-04-16 NOTE — Progress Notes (Signed)
Patient ID: Lisa Cabrera, female   DOB: 01/04/1946, 78 y.o.   MRN: [id number]     78 y.o.  seen by Dr Royann Shivers  of [facility] September 2014 . 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 Understands Need for SBE given above   Back pain with history of discectomy with Dr Lovell Sheehan some foraminal stenosis on MRI L3-5 on 10/18/20  Had lots of PT end of 2024   No issues active likes to play cards and ukulele  They met at [facility] bee married over 50 years    ROS: Denies fever, malais, weight loss, blurry vision, decreased visual acuity, cough, sputum, SOB, hemoptysis, pleuritic pain, palpitaitons, heartburn, abdominal pain, melena, lower extremity edema, claudication, or rash.  All other systems reviewed and negative  General: BP (!) 142/60   Pulse 82   Ht 5' 1.5" (1.562 m)   Wt 124 lb 12.8 oz (56.6 kg)   SpO2 97%   BMI 23.20 kg/m  Affect appropriate Healthy:  appears stated age HEENT: normal Neck supple with no adenopathy JVP normal no bruits no thyromegaly Lungs clear with no wheezing and good diaphragmatic motion Heart:  S1/S2 mid systolic ejection murmur of MVP/MR no MS murmur, no rub, gallop or click PMI normal Abdomen: benighn, BS positve, no tenderness, no AAA no bruit.  No HSM or HJR Distal pulses intact with no bruits No edema Neuro non-focal Skin warm and dry No muscular weakness    Current Outpatient Medications  Medication Sig Dispense Refill   aspirin 81 MG chewable tablet Chew 1 tablet (81 mg total) by mouth 2 (two) times daily. 30 tablet 0   Biotin 69629 MCG TABS Take 10,000 mcg by mouth daily.     calcium gluconate 500 MG tablet Take 2 tablets by mouth daily.      Cholecalciferol (VITAMIN  D) 2000 UNITS CAPS Take 2,000 Units by mouth daily.      doxepin (SINEQUAN) 25 MG capsule Take 25 mg by mouth daily.  4   losartan (COZAAR) 50 MG tablet Take 50 mg by mouth daily.     Multiple Vitamins-Minerals (MULTIVITAMIN PO) Take 1 tablet by mouth daily.      Naphazoline-Pheniramine (OPCON-A) 0.027-0.315 % SOLN Place 1 drop into both eyes in the morning and at bedtime.     Omega-3 Fatty Acids (FISH OIL PO) Take 1 capsule by mouth daily.      pravastatin (PRAVACHOL) 20 MG tablet Take 20 mg by mouth daily.     verapamil (CALAN-SR) 240 MG CR tablet Take 240 mg by mouth daily.      fluticasone (FLONASE) 50 MCG/ACT nasal spray Place 1 spray into both nostrils at bedtime.     metroNIDAZOLE (METROGEL) 1 % gel Apply 1 application topically daily. ROSACEA     traMADol (ULTRAM) 50 MG tablet TAKE 1-2 TABLETS BY MOUTH EVERY 6 HOURS AS NEEDED. (Patient not taking: Reported on 04/30/2022) 30 tablet 0   No current facility-administered medications for this visit.    Allergies  Monascus purpureus went yeast  Electrocardiogram:  04/30/2023 NSR rate 68 normal    Assessment and Plan HTN:  Well controlled.  Continue current medications and low sodium Dash type diet.  BP better  at home   MVP/MR:  Stable mild-moderate  MR and anterior leaflet prolapse by TTE 03/09/21 Life long SBE prophylaxis with underlying abnormal valve and history of SBE  Update TTE   Ortho:  Post Right THR doing well f/u [facility] Back pain consider f/u with Dr Lovell Sheehan and lumbar steroid injection if back pain worsens again    TTE   F/U in a year   Charlton Haws

## 2023-04-24 DIAGNOSIS — H02831 Dermatochalasis of right upper eyelid: Secondary | ICD-10-CM | POA: Diagnosis not present

## 2023-04-24 DIAGNOSIS — H02834 Dermatochalasis of left upper eyelid: Secondary | ICD-10-CM | POA: Diagnosis not present

## 2023-04-24 DIAGNOSIS — H2512 Age-related nuclear cataract, left eye: Secondary | ICD-10-CM | POA: Diagnosis not present

## 2023-04-24 DIAGNOSIS — H52203 Unspecified astigmatism, bilateral: Secondary | ICD-10-CM | POA: Diagnosis not present

## 2023-04-24 DIAGNOSIS — H5211 Myopia, right eye: Secondary | ICD-10-CM | POA: Diagnosis not present

## 2023-04-28 DIAGNOSIS — H8112 Benign paroxysmal vertigo, left ear: Secondary | ICD-10-CM | POA: Diagnosis not present

## 2023-04-30 ENCOUNTER — Ambulatory Visit: Payer: Medicare Other | Attending: Cardiovascular Disease | Admitting: Cardiovascular Disease

## 2023-04-30 VITALS — BP 142/60 | HR 82 | Ht 61.5 in | Wt 124.8 lb

## 2023-04-30 DIAGNOSIS — I341 Nonrheumatic mitral (valve) prolapse: Secondary | ICD-10-CM | POA: Diagnosis not present

## 2023-04-30 DIAGNOSIS — I10 Essential (primary) hypertension: Secondary | ICD-10-CM

## 2023-04-30 NOTE — Patient Instructions (Signed)
Medication Instructions:  Your physician recommends that you continue on your current medications as directed. Please refer to the Current Medication list given to you today.  *If you need a refill on your cardiac medications before your next appointment, please call your pharmacy*  Lab Work: If you have labs (blood work) drawn today and your tests are completely normal, you will receive your results only by: MyChart Message (if you have MyChart) OR A paper copy in the mail If you have any lab test that is abnormal or we need to change your treatment, we will call you to review the results.   Testing/Procedures: Your physician has requested that you have an echocardiogram. Echocardiography is a painless test that uses sound waves to create images of your heart. It provides your doctor with information about the size and shape of your heart and how well your heart's chambers and valves are working. This procedure takes approximately one hour. There are no restrictions for this procedure. Please do NOT wear cologne, perfume, aftershave, or lotions (deodorant is allowed). Please arrive 15 minutes prior to your appointment time.  Please note: We ask at that you not bring children with you during ultrasound (echo/ vascular) testing. Due to room size and safety concerns, children are not allowed in the ultrasound rooms during exams. Our front office staff cannot provide observation of children in our lobby area while testing is being conducted. An adult accompanying a patient to their appointment will only be allowed in the ultrasound room at the discretion of the ultrasound technician under special circumstances. We apologize for any inconvenience. Follow-Up: At [facility], you and your health needs are our priority.  As part of our continuing mission to provide you with exceptional heart care, we have created designated Provider Care Teams.  These Care Teams include your primary  Cardiologist (physician) and Advanced Practice Providers (APPs -  Physician Assistants and Nurse Practitioners) who all work together to provide you with the care you need, when you need it.  We recommend signing up for the patient portal called "MyChart".  Sign up information is provided on this After Visit Summary.  MyChart is used to connect with patients for Virtual Visits (Telemedicine).  Patients are able to view lab/test results, encounter notes, upcoming appointments, etc.  Non-urgent messages can be sent to your provider as well.   To learn more about what you can do with MyChart, go to [email or web address].    Your next appointment:   1 year(s)  Provider:   Charlton Haws, MD     Other Instructions    1st Floor: - Lobby - Registration  - Pharmacy  - Lab - Cafe  2nd Floor: - PV Lab - Diagnostic Testing (echo, CT, nuclear med)  3rd Floor: - Vacant  4th Floor: - TCTS (cardiothoracic surgery) - AFib Clinic - Structural Heart Clinic - Vascular Surgery  - Vascular Ultrasound  5th Floor: - [facility] (general and EP) - Clinical Pharmacy for coumadin, hypertension, lipid, weight-loss medications, and med management appointments    Valet parking services will be available as well.

## 2023-05-01 DIAGNOSIS — Z1231 Encounter for screening mammogram for malignant neoplasm of breast: Secondary | ICD-10-CM | POA: Diagnosis not present

## 2023-05-08 DIAGNOSIS — H8112 Benign paroxysmal vertigo, left ear: Secondary | ICD-10-CM | POA: Diagnosis not present

## 2023-05-15 DIAGNOSIS — H8112 Benign paroxysmal vertigo, left ear: Secondary | ICD-10-CM | POA: Diagnosis not present

## 2023-05-19 ENCOUNTER — Ambulatory Visit (HOSPITAL_COMMUNITY): Payer: Medicare Other

## 2023-05-22 DIAGNOSIS — J069 Acute upper respiratory infection, unspecified: Secondary | ICD-10-CM | POA: Diagnosis not present

## 2023-05-22 DIAGNOSIS — R051 Acute cough: Secondary | ICD-10-CM | POA: Diagnosis not present

## 2023-05-29 DIAGNOSIS — Z09 Encounter for follow-up examination after completed treatment for conditions other than malignant neoplasm: Secondary | ICD-10-CM | POA: Diagnosis not present

## 2023-05-29 DIAGNOSIS — M419 Scoliosis, unspecified: Secondary | ICD-10-CM | POA: Diagnosis not present

## 2023-05-29 DIAGNOSIS — R42 Dizziness and giddiness: Secondary | ICD-10-CM | POA: Diagnosis not present

## 2023-05-29 DIAGNOSIS — R9389 Abnormal findings on diagnostic imaging of other specified body structures: Secondary | ICD-10-CM | POA: Diagnosis not present

## 2023-05-29 DIAGNOSIS — R059 Cough, unspecified: Secondary | ICD-10-CM | POA: Diagnosis not present

## 2023-05-29 DIAGNOSIS — Z6823 Body mass index (BMI) 23.0-23.9, adult: Secondary | ICD-10-CM | POA: Diagnosis not present

## 2023-06-06 DIAGNOSIS — H8112 Benign paroxysmal vertigo, left ear: Secondary | ICD-10-CM | POA: Diagnosis not present

## 2023-06-10 ENCOUNTER — Telehealth: Payer: Self-pay

## 2023-06-10 ENCOUNTER — Ambulatory Visit (HOSPITAL_COMMUNITY): Payer: Medicare Other | Attending: Cardiology

## 2023-06-10 DIAGNOSIS — I341 Nonrheumatic mitral (valve) prolapse: Secondary | ICD-10-CM

## 2023-06-10 DIAGNOSIS — I10 Essential (primary) hypertension: Secondary | ICD-10-CM | POA: Insufficient documentation

## 2023-06-10 LAB — ECHOCARDIOGRAM COMPLETE
Area-P 1/2: 3.42 cm2
S' Lateral: 2.2 cm

## 2023-06-10 NOTE — Telephone Encounter (Signed)
-----   Message from Charlton Haws sent at 06/10/2023  2:57 PM EDT ----- EF normal moderate MR with prolapse f/u echo in a year

## 2023-06-10 NOTE — Telephone Encounter (Signed)
 The patient has been notified of the result and verbalized understanding.  All questions (if any) were answered. Cindi Carbon Cook, RN 06/10/2023 4:55 PM   Will place order for echo in one year.

## 2023-06-24 DIAGNOSIS — J90 Pleural effusion, not elsewhere classified: Secondary | ICD-10-CM | POA: Diagnosis not present

## 2023-06-30 DIAGNOSIS — E7800 Pure hypercholesterolemia, unspecified: Secondary | ICD-10-CM | POA: Diagnosis not present

## 2023-06-30 DIAGNOSIS — I10 Essential (primary) hypertension: Secondary | ICD-10-CM | POA: Diagnosis not present

## 2023-07-30 DIAGNOSIS — I10 Essential (primary) hypertension: Secondary | ICD-10-CM | POA: Diagnosis not present

## 2023-07-30 DIAGNOSIS — E7800 Pure hypercholesterolemia, unspecified: Secondary | ICD-10-CM | POA: Diagnosis not present

## 2023-08-26 DIAGNOSIS — D2239 Melanocytic nevi of other parts of face: Secondary | ICD-10-CM | POA: Diagnosis not present

## 2023-08-26 DIAGNOSIS — L820 Inflamed seborrheic keratosis: Secondary | ICD-10-CM | POA: Diagnosis not present

## 2023-08-26 DIAGNOSIS — L814 Other melanin hyperpigmentation: Secondary | ICD-10-CM | POA: Diagnosis not present

## 2023-08-26 DIAGNOSIS — L578 Other skin changes due to chronic exposure to nonionizing radiation: Secondary | ICD-10-CM | POA: Diagnosis not present

## 2023-08-26 DIAGNOSIS — D225 Melanocytic nevi of trunk: Secondary | ICD-10-CM | POA: Diagnosis not present

## 2023-08-26 DIAGNOSIS — D2271 Melanocytic nevi of right lower limb, including hip: Secondary | ICD-10-CM | POA: Diagnosis not present

## 2023-08-26 DIAGNOSIS — L570 Actinic keratosis: Secondary | ICD-10-CM | POA: Diagnosis not present

## 2023-08-26 DIAGNOSIS — L821 Other seborrheic keratosis: Secondary | ICD-10-CM | POA: Diagnosis not present

## 2023-08-26 DIAGNOSIS — L719 Rosacea, unspecified: Secondary | ICD-10-CM | POA: Diagnosis not present

## 2023-08-28 DIAGNOSIS — Z6823 Body mass index (BMI) 23.0-23.9, adult: Secondary | ICD-10-CM | POA: Diagnosis not present

## 2023-08-28 DIAGNOSIS — R42 Dizziness and giddiness: Secondary | ICD-10-CM | POA: Diagnosis not present

## 2023-11-06 ENCOUNTER — Ambulatory Visit: Admitting: Neurology

## 2023-11-28 DIAGNOSIS — L570 Actinic keratosis: Secondary | ICD-10-CM | POA: Diagnosis not present

## 2023-11-28 DIAGNOSIS — L820 Inflamed seborrheic keratosis: Secondary | ICD-10-CM | POA: Diagnosis not present

## 2023-12-13 DIAGNOSIS — Z23 Encounter for immunization: Secondary | ICD-10-CM | POA: Diagnosis not present

## 2024-01-05 DIAGNOSIS — E7800 Pure hypercholesterolemia, unspecified: Secondary | ICD-10-CM | POA: Diagnosis not present

## 2024-01-05 DIAGNOSIS — I10 Essential (primary) hypertension: Secondary | ICD-10-CM | POA: Diagnosis not present

## 2024-01-12 DIAGNOSIS — I10 Essential (primary) hypertension: Secondary | ICD-10-CM | POA: Diagnosis not present

## 2024-01-12 DIAGNOSIS — E7800 Pure hypercholesterolemia, unspecified: Secondary | ICD-10-CM | POA: Diagnosis not present

## 2024-01-12 DIAGNOSIS — G43909 Migraine, unspecified, not intractable, without status migrainosus: Secondary | ICD-10-CM | POA: Diagnosis not present

## 2024-01-12 DIAGNOSIS — Z6823 Body mass index (BMI) 23.0-23.9, adult: Secondary | ICD-10-CM | POA: Diagnosis not present

## 2024-01-12 DIAGNOSIS — M25511 Pain in right shoulder: Secondary | ICD-10-CM | POA: Diagnosis not present

## 2024-01-12 DIAGNOSIS — K589 Irritable bowel syndrome without diarrhea: Secondary | ICD-10-CM | POA: Diagnosis not present

## 2024-01-12 DIAGNOSIS — F430 Acute stress reaction: Secondary | ICD-10-CM | POA: Diagnosis not present

## 2024-01-12 DIAGNOSIS — Z0000 Encounter for general adult medical examination without abnormal findings: Secondary | ICD-10-CM | POA: Diagnosis not present

## 2024-01-12 DIAGNOSIS — Z23 Encounter for immunization: Secondary | ICD-10-CM | POA: Diagnosis not present

## 2024-01-14 DIAGNOSIS — M25511 Pain in right shoulder: Secondary | ICD-10-CM | POA: Diagnosis not present

## 2024-01-28 DIAGNOSIS — M25511 Pain in right shoulder: Secondary | ICD-10-CM | POA: Diagnosis not present

## 2024-01-28 DIAGNOSIS — M25611 Stiffness of right shoulder, not elsewhere classified: Secondary | ICD-10-CM | POA: Diagnosis not present

## 2024-02-04 DIAGNOSIS — M25511 Pain in right shoulder: Secondary | ICD-10-CM | POA: Diagnosis not present

## 2024-02-04 DIAGNOSIS — M25611 Stiffness of right shoulder, not elsewhere classified: Secondary | ICD-10-CM | POA: Diagnosis not present

## 2024-02-06 DIAGNOSIS — M25611 Stiffness of right shoulder, not elsewhere classified: Secondary | ICD-10-CM | POA: Diagnosis not present

## 2024-02-06 DIAGNOSIS — M25511 Pain in right shoulder: Secondary | ICD-10-CM | POA: Diagnosis not present

## 2024-02-19 DIAGNOSIS — M25511 Pain in right shoulder: Secondary | ICD-10-CM | POA: Diagnosis not present

## 2024-03-01 DIAGNOSIS — M25611 Stiffness of right shoulder, not elsewhere classified: Secondary | ICD-10-CM | POA: Diagnosis not present

## 2024-03-01 DIAGNOSIS — M25511 Pain in right shoulder: Secondary | ICD-10-CM | POA: Diagnosis not present

## 2024-03-09 NOTE — Progress Notes (Unsigned)
 [facility]  PATIENT: Lisa Cabrera DOB: 25-May-1945  REFERRING DOCTOR OR PCP:  *** SOURCE: ***  _________________________________   HISTORICAL  CHIEF COMPLAINT:  No chief complaint on file.   HISTORY OF PRESENT ILLNESS:  ***   Imaging: Lea of the brain 05/28/2018 showed minimal chronic microvascular ischemic changes.  Brain volume was normal.  There were no acute findings.  The internal auditory canals appear normal but this study was done without contrast  REVIEW OF SYSTEMS: Constitutional: No fevers, chills, sweats, or change in appetite Eyes: No visual changes, double vision, eye pain Ear, nose and throat: No hearing loss, ear pain, nasal congestion, sore throat Cardiovascular: No chest pain, palpitations Respiratory:  No shortness of breath at rest or with exertion.   No wheezes GastrointestinaI: No nausea, vomiting, diarrhea, abdominal pain, fecal incontinence Genitourinary:  No dysuria, urinary retention or frequency.  No nocturia. Musculoskeletal:  No neck pain, back pain Integumentary: No rash, pruritus, skin lesions Neurological: as above Psychiatric: No depression at this time.  No anxiety Endocrine: No palpitations, diaphoresis, change in appetite, change in weigh or increased thirst Hematologic/Lymphatic:  No anemia, purpura, petechiae. Allergic/Immunologic: No itchy/runny eyes, nasal congestion, recent allergic reactions, rashes  ALLERGIES: Allergies  Allergen Reactions   [place] Purpureus Went Yeast Other (See Comments)    HOME MEDICATIONS:  Current Outpatient Medications:    aspirin  81 MG chewable tablet, Chew 1 tablet (81 mg total) by mouth 2 (two) times daily., Disp: 30 tablet, Rfl: 0   Biotin  10000 MCG TABS, Take 10,000 mcg by mouth daily., Disp: , Rfl:    calcium  gluconate 500 MG tablet, Take 2 tablets by mouth daily. , Disp: , Rfl:    Cholecalciferol  (VITAMIN D ) 2000 UNITS CAPS, Take 2,000 Units by mouth daily. , Disp:  , Rfl:    doxepin  (SINEQUAN ) 25 MG capsule, Take 25 mg by mouth daily., Disp: , Rfl: 4   fluticasone  (FLONASE ) 50 MCG/ACT nasal spray, Place 1 spray into both nostrils at bedtime., Disp: , Rfl:    losartan (COZAAR) 50 MG tablet, Take 50 mg by mouth daily., Disp: , Rfl:    metroNIDAZOLE (METROGEL) 1 % gel, Apply 1 application topically daily. ROSACEA, Disp: , Rfl:    Multiple Vitamins-Minerals (MULTIVITAMIN PO), Take 1 tablet by mouth daily. , Disp: , Rfl:    Naphazoline-Pheniramine (OPCON-A ) 0.027-0.315 % SOLN, Place 1 drop into both eyes in the morning and at bedtime., Disp: , Rfl:    Omega-3 Fatty Acids (FISH OIL PO), Take 1 capsule by mouth daily. , Disp: , Rfl:    pravastatin  (PRAVACHOL ) 20 MG tablet, Take 20 mg by mouth daily., Disp: , Rfl:    traMADol  (ULTRAM ) 50 MG tablet, TAKE 1-2 TABLETS BY MOUTH EVERY 6 HOURS AS NEEDED. (Patient not taking: Reported on 04/30/2022), Disp: 30 tablet, Rfl: 0   verapamil  (CALAN -SR) 240 MG CR tablet, Take 240 mg by mouth daily. , Disp: , Rfl:   PAST MEDICAL HISTORY: Past Medical History:  Diagnosis Date   Anemia    normocytic   Complication of anesthesia    HAD RAPID HEART BEAT AFTER ANESTHIA   Family history of anesthesia complication    Father had difficulty waking up   H/O seasonal allergies    Heart murmur     PAST SURGICAL HISTORY: Past Surgical History:  Procedure Laterality Date   ABDOMINAL HYSTERECTOMY     1997   LUMBAR DISC SURGERY     herniated disk   SPINE SURGERY  TEE WITHOUT CARDIOVERSION N/A 12/07/2012   Procedure: TRANSESOPHAGEAL ECHOCARDIOGRAM (TEE);  Surgeon: Jerel Balding, MD;  Location: [facility] ENDOSCOPY;  Service: Cardiovascular;  Laterality: N/A;   TOTAL HIP ARTHROPLASTY Right 09/10/2019   Procedure: RIGHT TOTAL HIP ARTHROPLASTY ANTERIOR APPROACH;  Surgeon: Vernetta Lonni GRADE, MD;  Location: [facility] ORS;  Service: Orthopedics;  Laterality: Right;    FAMILY HISTORY: Family History  Problem Relation Age of Onset   Dementia  Mother    Heart attack Mother    Stroke Father    Kidney disease Father    Cancer Sister        ovarian    SOCIAL HISTORY: Social History   Socioeconomic History   Marital status: Married    Spouse name: Not on file   Number of children: 1   Years of education: college   Highest education level: Not on file  Occupational History   Occupation: retired  Tobacco Use   Smoking status: Never   Smokeless tobacco: Never  Vaping Use   Vaping status: Never Used  Substance and Sexual Activity   Alcohol use: Yes    Comment: occasionally   Drug use: No   Sexual activity: Yes  Other Topics Concern   Not on file  Social History Narrative   Exercise: walk   Social Drivers of Corporate Investment Banker Strain: Not on file  Food Insecurity: Not on file  Transportation Needs: Not on file  Physical Activity: Not on file  Stress: Not on file  Social Connections: Not on file  Intimate Partner Violence: Not on file       PHYSICAL EXAM  There were no vitals filed for this visit.  There is no height or weight on file to calculate BMI.   General: The patient is well-developed and well-nourished and in no acute distress  HEENT:  Head is [place]/AT.  Sclera are anicteric.  Funduscopic exam shows normal optic discs and retinal vessels.  Neck: No carotid bruits are noted.  The neck is nontender.  Cardiovascular: The heart has a regular rate and rhythm with a normal S1 and S2. There were no murmurs, gallops or rubs.    Skin: Extremities are without rash or  edema.  Musculoskeletal:  Back is nontender  Neurologic Exam  Mental status: The patient is alert and oriented x 3 at the time of the examination. The patient has apparent normal recent and remote memory, with an apparently normal attention span and concentration ability.   Speech is normal.  Cranial nerves: Extraocular movements are full. Pupils are equal, round, and reactive to light and accomodation.  Visual fields are  full.  Facial symmetry is present. There is good facial sensation to soft touch bilaterally.Facial strength is normal.  Trapezius and sternocleidomastoid strength is normal. No dysarthria is noted.  The tongue is midline, and the patient has symmetric elevation of the soft palate. No obvious hearing deficits are noted.  Motor:  Muscle bulk is normal.   Tone is normal. Strength is  5 / 5 in all 4 extremities.   Sensory: Sensory testing is intact to pinprick, soft touch and vibration sensation in all 4 extremities.  Coordination: Cerebellar testing reveals good finger-nose-finger and heel-to-shin bilaterally.  Gait and station: Station is normal.   Gait is normal. Tandem gait is normal. Romberg is negative.   Reflexes: Deep tendon reflexes are symmetric and normal bilaterally.   Plantar responses are flexor.    DIAGNOSTIC DATA (LABS, IMAGING, TESTING) - I reviewed patient records,  labs, notes, testing and imaging myself where available.  Lab Results  Component Value Date   WBC 11.3 (H) 09/11/2019   HGB 10.2 (L) 09/11/2019   HCT 30.7 (L) 09/11/2019   MCV 96.2 09/11/2019   PLT 220 09/11/2019      Component Value Date/Time   NA 136 09/11/2019 0301   K 4.5 09/11/2019 0301   CL 104 09/11/2019 0301   CO2 26 09/11/2019 0301   GLUCOSE 162 (H) 09/11/2019 0301   BUN 19 09/11/2019 0301   CREATININE 1.05 (H) 09/11/2019 0301   CREATININE 0.84 12/02/2012 1808   CALCIUM  7.9 (L) 09/11/2019 0301   PROT 6.6 12/05/2012 0509   ALBUMIN 2.5 (L) 12/05/2012 0509   AST 25 12/05/2012 0509   ALT 16 12/05/2012 0509   ALKPHOS 66 12/05/2012 0509   BILITOT 0.3 12/05/2012 0509   GFRNONAA 53 (L) 09/11/2019 0301   GFRNONAA 73 12/02/2012 1808   GFRAA >60 09/11/2019 0301   GFRAA 84 12/02/2012 1808   No results found for: CHOL, HDL, LDLCALC, LDLDIRECT, TRIG, CHOLHDL No results found for: YHAJ8R No results found for: VITAMINB12 Lab Results  Component Value Date   TSH 1.334 11/22/2012        ASSESSMENT AND PLAN  ***   Zyairah Wacha A. Vear, MD, [facility] 03/09/2024, 8:16 PM Certified in Neurology, Clinical Neurophysiology, Sleep Medicine and Neuroimaging  [facility] [address], [address] [place], [place] [address] [phone]

## 2024-03-10 ENCOUNTER — Ambulatory Visit: Admitting: Neurology

## 2024-03-10 ENCOUNTER — Encounter: Payer: Self-pay | Admitting: Neurology

## 2024-03-10 VITALS — BP 166/77 | HR 85 | Ht 61.0 in | Wt 118.0 lb

## 2024-03-10 DIAGNOSIS — H814 Vertigo of central origin: Secondary | ICD-10-CM

## 2024-03-10 DIAGNOSIS — G450 Vertebro-basilar artery syndrome: Secondary | ICD-10-CM | POA: Diagnosis not present

## 2024-03-10 DIAGNOSIS — H93A3 Pulsatile tinnitus, bilateral: Secondary | ICD-10-CM

## 2024-03-10 DIAGNOSIS — G43709 Chronic migraine without aura, not intractable, without status migrainosus: Secondary | ICD-10-CM | POA: Diagnosis not present

## 2024-03-10 DIAGNOSIS — R351 Nocturia: Secondary | ICD-10-CM | POA: Diagnosis not present

## 2024-03-10 MED ORDER — ZONISAMIDE 50 MG PO CAPS: 50.0000 mg | ORAL_CAPSULE | Freq: Every day | ORAL | 5 refills | Status: AC

## 2024-03-11 ENCOUNTER — Telehealth: Payer: Self-pay | Admitting: Neurology

## 2024-03-11 NOTE — Telephone Encounter (Signed)
 no auth required sent to [facility] [phone]

## 2024-03-12 ENCOUNTER — Encounter: Payer: Self-pay | Admitting: Cardiovascular Disease

## 2024-03-18 ENCOUNTER — Encounter: Payer: Self-pay | Admitting: Neurology

## 2024-03-18 NOTE — Telephone Encounter (Signed)
 Merged with other mychart message encounter.

## 2024-04-08 ENCOUNTER — Other Ambulatory Visit

## 2024-05-05 ENCOUNTER — Ambulatory Visit
Admission: RE | Admit: 2024-05-05 | Discharge: 2024-05-05 | Disposition: A | Source: Ambulatory Visit | Attending: Neurology | Admitting: Neurology

## 2024-05-05 DIAGNOSIS — G450 Vertebro-basilar artery syndrome: Secondary | ICD-10-CM

## 2024-05-05 DIAGNOSIS — H93A3 Pulsatile tinnitus, bilateral: Secondary | ICD-10-CM

## 2024-05-05 DIAGNOSIS — H814 Vertigo of central origin: Secondary | ICD-10-CM

## 2024-05-05 MED ORDER — GADOPICLENOL 0.5 MMOL/ML IV SOLN
5.0000 mL | Freq: Once | INTRAVENOUS | Status: AC | PRN
  Administered 2024-05-05: 5 mL via INTRAVENOUS

## 2024-05-06 ENCOUNTER — Ambulatory Visit: Payer: Self-pay | Admitting: Neurology

## 2024-05-25 ENCOUNTER — Ambulatory Visit: Admitting: Cardiovascular Disease
